# Patient Record
Sex: Female | Born: 1940 | Race: White | Hispanic: No | Marital: Single | Smoking: Former smoker
Health system: Southern US, Community
[De-identification: ages and names within clinical notes are randomized; demographics above are authoritative.]

## PROBLEM LIST (undated history)

## (undated) DIAGNOSIS — K219 Gastro-esophageal reflux disease without esophagitis: Secondary | ICD-10-CM

## (undated) DIAGNOSIS — F32A Depression, unspecified: Secondary | ICD-10-CM

## (undated) DIAGNOSIS — Z85118 Personal history of other malignant neoplasm of bronchus and lung: Secondary | ICD-10-CM

## (undated) DIAGNOSIS — F329 Major depressive disorder, single episode, unspecified: Secondary | ICD-10-CM

## (undated) DIAGNOSIS — J449 Chronic obstructive pulmonary disease, unspecified: Secondary | ICD-10-CM

## (undated) DIAGNOSIS — K449 Diaphragmatic hernia without obstruction or gangrene: Secondary | ICD-10-CM

## (undated) DIAGNOSIS — D509 Iron deficiency anemia, unspecified: Secondary | ICD-10-CM

## (undated) HISTORY — PX: ESOPHAGOGASTRODUODENOSCOPY: SHX1529

## (undated) HISTORY — PX: DIAPHRAGMATIC HERNIA REPAIR: SUR1167

## (undated) HISTORY — PX: GASTRIC FUNDOPLICATION: SHX226

## (undated) HISTORY — PX: COLONOSCOPY: SHX174

---

## 2011-07-15 DIAGNOSIS — C3491 Malignant neoplasm of unspecified part of right bronchus or lung: Secondary | ICD-10-CM | POA: Insufficient documentation

## 2012-11-06 DIAGNOSIS — Z9889 Other specified postprocedural states: Secondary | ICD-10-CM | POA: Insufficient documentation

## 2012-11-06 DIAGNOSIS — K219 Gastro-esophageal reflux disease without esophagitis: Secondary | ICD-10-CM | POA: Insufficient documentation

## 2016-04-12 DIAGNOSIS — J189 Pneumonia, unspecified organism: Secondary | ICD-10-CM | POA: Insufficient documentation

## 2016-04-12 DIAGNOSIS — J181 Lobar pneumonia, unspecified organism: Secondary | ICD-10-CM

## 2017-10-17 DIAGNOSIS — J9601 Acute respiratory failure with hypoxia: Secondary | ICD-10-CM | POA: Insufficient documentation

## 2018-01-04 ENCOUNTER — Inpatient Hospital Stay
Admission: RE | Admit: 2018-01-04 | Discharge: 2018-02-16 | Disposition: A | Discharge status: Home / Self Care | Payer: Medicare Other | Attending: Internal Medicine | Admitting: Internal Medicine

## 2018-01-04 DIAGNOSIS — K222 Esophageal obstruction: Secondary | ICD-10-CM

## 2018-01-04 DIAGNOSIS — K449 Diaphragmatic hernia without obstruction or gangrene: Secondary | ICD-10-CM

## 2018-01-04 DIAGNOSIS — Z9289 Personal history of other medical treatment: Secondary | ICD-10-CM

## 2018-01-04 DIAGNOSIS — Z789 Other specified health status: Secondary | ICD-10-CM

## 2018-01-04 DIAGNOSIS — J811 Chronic pulmonary edema: Secondary | ICD-10-CM

## 2018-01-04 DIAGNOSIS — Z934 Other artificial openings of gastrointestinal tract status: Secondary | ICD-10-CM

## 2018-01-04 DIAGNOSIS — J449 Chronic obstructive pulmonary disease, unspecified: Secondary | ICD-10-CM | POA: Diagnosis present

## 2018-01-04 DIAGNOSIS — T85598A Other mechanical complication of other gastrointestinal prosthetic devices, implants and grafts, initial encounter: Secondary | ICD-10-CM

## 2018-01-04 DIAGNOSIS — J9690 Respiratory failure, unspecified, unspecified whether with hypoxia or hypercapnia: Secondary | ICD-10-CM

## 2018-01-04 DIAGNOSIS — Z9889 Other specified postprocedural states: Secondary | ICD-10-CM

## 2018-01-04 DIAGNOSIS — Z85118 Personal history of other malignant neoplasm of bronchus and lung: Secondary | ICD-10-CM

## 2018-01-04 DIAGNOSIS — J9600 Acute respiratory failure, unspecified whether with hypoxia or hypercapnia: Secondary | ICD-10-CM

## 2018-01-04 DIAGNOSIS — Z931 Gastrostomy status: Secondary | ICD-10-CM

## 2018-01-04 DIAGNOSIS — K219 Gastro-esophageal reflux disease without esophagitis: Secondary | ICD-10-CM

## 2018-01-04 DIAGNOSIS — J90 Pleural effusion, not elsewhere classified: Secondary | ICD-10-CM

## 2018-01-04 HISTORY — DX: Personal history of other malignant neoplasm of bronchus and lung: Z85.118

## 2018-01-04 HISTORY — DX: Iron deficiency anemia, unspecified: D50.9

## 2018-01-04 HISTORY — DX: Chronic obstructive pulmonary disease, unspecified: J44.9

## 2018-01-04 HISTORY — DX: Major depressive disorder, single episode, unspecified: F32.9

## 2018-01-04 HISTORY — DX: Gastro-esophageal reflux disease without esophagitis: K21.9

## 2018-01-04 HISTORY — DX: Diaphragmatic hernia without obstruction or gangrene: K44.9

## 2018-01-04 HISTORY — DX: Depression, unspecified: F32.A

## 2018-01-05 ENCOUNTER — Encounter: Payer: Self-pay | Admitting: Internal Medicine

## 2018-01-05 ENCOUNTER — Other Ambulatory Visit (HOSPITAL_COMMUNITY): Payer: Self-pay

## 2018-01-05 DIAGNOSIS — K219 Gastro-esophageal reflux disease without esophagitis: Secondary | ICD-10-CM

## 2018-01-05 DIAGNOSIS — I482 Chronic atrial fibrillation: Secondary | ICD-10-CM | POA: Diagnosis not present

## 2018-01-05 DIAGNOSIS — J449 Chronic obstructive pulmonary disease, unspecified: Secondary | ICD-10-CM | POA: Diagnosis not present

## 2018-01-05 DIAGNOSIS — Z85118 Personal history of other malignant neoplasm of bronchus and lung: Secondary | ICD-10-CM | POA: Diagnosis not present

## 2018-01-05 DIAGNOSIS — J9621 Acute and chronic respiratory failure with hypoxia: Secondary | ICD-10-CM | POA: Diagnosis not present

## 2018-01-05 DIAGNOSIS — K449 Diaphragmatic hernia without obstruction or gangrene: Secondary | ICD-10-CM

## 2018-01-05 DIAGNOSIS — K222 Esophageal obstruction: Secondary | ICD-10-CM

## 2018-01-05 DIAGNOSIS — F419 Anxiety disorder, unspecified: Secondary | ICD-10-CM | POA: Insufficient documentation

## 2018-01-05 LAB — COMPREHENSIVE METABOLIC PANEL
ALBUMIN: 1.9 g/dL — AB (ref 3.5–5.0)
ALT: 19 U/L (ref 14–54)
AST: 19 U/L (ref 15–41)
Alkaline Phosphatase: 438 U/L — ABNORMAL HIGH (ref 38–126)
Anion gap: 9 (ref 5–15)
BILIRUBIN TOTAL: 0.4 mg/dL (ref 0.3–1.2)
BUN: 36 mg/dL — AB (ref 6–20)
CO2: 32 mmol/L (ref 22–32)
Calcium: 8.2 mg/dL — ABNORMAL LOW (ref 8.9–10.3)
Chloride: 104 mmol/L (ref 101–111)
Creatinine, Ser: 0.76 mg/dL (ref 0.44–1.00)
GFR calc Af Amer: >60 mL/min (ref >60)
GFR calc non Af Amer: >60 mL/min (ref >60)
Glucose, Bld: 102 mg/dL — ABNORMAL HIGH (ref 65–99)
POTASSIUM: 3.2 mmol/L — AB (ref 3.5–5.1)
Sodium: 145 mmol/L (ref 135–145)
TOTAL PROTEIN: 5.5 g/dL — AB (ref 6.5–8.1)

## 2018-01-05 LAB — CBC
HEMATOCRIT: 24.9 % — AB (ref 36.0–46.0)
HEMOGLOBIN: 7.3 g/dL — AB (ref 12.0–15.0)
MCH: 27.8 pg (ref 26.0–34.0)
MCHC: 29.3 g/dL — ABNORMAL LOW (ref 30.0–36.0)
MCV: 94.7 fL (ref 78.0–100.0)
Platelets: 183 10*3/uL (ref 150–400)
RBC: 2.63 MIL/uL — AB (ref 3.87–5.11)
RDW: 19.3 % — AB (ref 11.5–15.5)
WBC: 5.9 10*3/uL (ref 4.0–10.5)

## 2018-01-05 LAB — SEDIMENTATION RATE: Sed Rate: 70 mm/hr — ABNORMAL HIGH (ref 0–22)

## 2018-01-05 LAB — TSH: TSH: 0.969 u[IU]/mL (ref 0.350–4.500)

## 2018-01-05 MED ORDER — SUCRALFATE 1 GM/10ML PO SUSP
1.00 g | ORAL | Status: DC
Start: 2018-01-05 — End: 2018-01-05

## 2018-01-05 MED ORDER — ENOXAPARIN SODIUM 40 MG/0.4ML ~~LOC~~ SOLN
40.00 | SUBCUTANEOUS | Status: DC
Start: 2018-01-05 — End: 2018-01-05

## 2018-01-05 MED ORDER — HYDROCODONE-ACETAMINOPHEN 7.5-325 MG/15ML PO SOLN
5.00 | ORAL | Status: DC
Start: ? — End: 2018-01-05

## 2018-01-05 MED ORDER — BUDESONIDE 0.5 MG/2ML IN SUSP
0.50 | RESPIRATORY_TRACT | Status: DC
Start: 2018-01-05 — End: 2018-01-05

## 2018-01-05 MED ORDER — RISPERIDONE 1 MG/ML PO SOLN
3.00 | ORAL | Status: DC
Start: 2018-01-05 — End: 2018-01-05

## 2018-01-05 MED ORDER — MELATONIN 3 MG PO TABS
6.00 | ORAL_TABLET | ORAL | Status: DC
Start: 2018-01-05 — End: 2018-01-05

## 2018-01-05 MED ORDER — DEXTROSE 10 % IV SOLN
125.00 | INTRAVENOUS | Status: DC
Start: ? — End: 2018-01-05

## 2018-01-05 MED ORDER — GENERIC EXTERNAL MEDICATION
6.25 | Status: DC
Start: ? — End: 2018-01-05

## 2018-01-05 MED ORDER — GENERIC EXTERNAL MEDICATION
Status: DC
Start: 2018-01-05 — End: 2018-01-05

## 2018-01-05 MED ORDER — LIDOCAINE HCL 2 % EX GEL
CUTANEOUS | Status: DC
Start: ? — End: 2018-01-05

## 2018-01-05 MED ORDER — GENERIC EXTERNAL MEDICATION
40.00 | Status: DC
Start: 2018-01-05 — End: 2018-01-05

## 2018-01-05 MED ORDER — PROCHLORPERAZINE EDISYLATE 5 MG/ML IJ SOLN
10.00 | INTRAMUSCULAR | Status: DC
Start: ? — End: 2018-01-05

## 2018-01-05 MED ORDER — ONDANSETRON HCL 4 MG/2ML IJ SOLN
4.00 | INTRAMUSCULAR | Status: DC
Start: ? — End: 2018-01-05

## 2018-01-05 MED ORDER — CLONAZEPAM 0.5 MG PO TABS
0.50 | ORAL_TABLET | ORAL | Status: DC
Start: 2018-01-05 — End: 2018-01-05

## 2018-01-05 MED ORDER — INSULIN LISPRO 100 UNIT/ML ~~LOC~~ SOLN
2.00 | SUBCUTANEOUS | Status: DC
Start: 2018-01-05 — End: 2018-01-05

## 2018-01-05 MED ORDER — IOPAMIDOL (ISOVUE-300) INJECTION 61%
INTRAVENOUS | Status: AC
  Filled 2018-01-05: qty 50

## 2018-01-05 MED ORDER — GENERIC EXTERNAL MEDICATION
5.00 | Status: DC
Start: ? — End: 2018-01-05

## 2018-01-05 MED ORDER — IPRATROPIUM-ALBUTEROL 0.5-2.5 (3) MG/3ML IN SOLN
3.00 | RESPIRATORY_TRACT | Status: DC
Start: 2018-01-05 — End: 2018-01-05

## 2018-01-05 MED ORDER — ALBUTEROL SULFATE (2.5 MG/3ML) 0.083% IN NEBU
2.50 | INHALATION_SOLUTION | RESPIRATORY_TRACT | Status: DC
Start: ? — End: 2018-01-05

## 2018-01-05 MED ORDER — POLYETHYLENE GLYCOL 3350 17 G PO PACK
17.00 g | PACK | ORAL | Status: DC
Start: 2018-01-05 — End: 2018-01-05

## 2018-01-05 MED ORDER — FLUOXETINE HCL 20 MG/5ML PO SOLN
20.00 | ORAL | Status: DC
Start: 2018-01-05 — End: 2018-01-05

## 2018-01-05 MED ORDER — METOPROLOL TARTRATE 25 MG PO TABS
25.00 | ORAL_TABLET | ORAL | Status: DC
Start: 2018-01-05 — End: 2018-01-05

## 2018-01-05 MED ORDER — ACETAMINOPHEN 650 MG/20.3ML PO SOLN
325.00 | ORAL | Status: DC
Start: 2018-01-05 — End: 2018-01-05

## 2018-01-05 MED ORDER — GENERIC EXTERNAL MEDICATION
200.00 | Status: DC
Start: ? — End: 2018-01-05

## 2018-01-05 MED ORDER — ARFORMOTEROL TARTRATE 15 MCG/2ML IN NEBU
15.00 | INHALATION_SOLUTION | RESPIRATORY_TRACT | Status: DC
Start: 2018-01-05 — End: 2018-01-05

## 2018-01-05 MED ORDER — PHENOL 1.4 % MT LIQD
1.00 | OROMUCOSAL | Status: DC
Start: ? — End: 2018-01-05

## 2018-01-05 NOTE — Consult Note (Signed)
Pulmonary [place] PULMONARY SERVICE  Date of Service:  January 05, 2018  PULMONARY CONSULT   Sherry Bennett  JQB:[id number]  DOB: Apr 18, 1941   DOA: 01/04/2018  Referring Physician: Merton Border, MD  HPI: Sherry Bennett is a 77 y.o. female seen for follow up of Acute on Chronic Respiratory Failure.  Patient has multiple medical problems presented to the hospital for a scheduled endoscopy.  Patient apparently been having some melena.  Patient underwent a procedure for the endoscopy and her course was complicated by multiple problems.  She was apparently found to have a paraesophageal hernia and had this repaired along with Nissan fundoplication.  She had to go back to the operating room for an exploratory laparotomy in at that time she was found to have gastric perforations which were repaired.  Thoracotomy was done with a washout.  Came back to the ICU intubated on the ventilator.  She was subsequently not able to come off the ventilator because of ongoing issues with pleural effusions which were loculated felt to be empyema.  Patient had chest tubes placed for this purpose.  In addition she had low hemoglobin requiring transfusions.  Patient developed problems with hypotension felt to be septic because of infections and complications related to the surgery.  She was eventually requiring mechanical ventilation ongoing and underwent a tracheostomy and is transferred to our facility for further management and weaning  Review of Systems:  ROS performed and is unremarkable other than noted above.  Past Medical History:  Diagnosis Date  . COPD (chronic obstructive pulmonary disease) ([place])   . Depression   . Esophageal hernia   . GERD (gastroesophageal reflux disease)   . History of lung cancer   . Iron deficiency anemia     Past Surgical History:  Procedure Laterality Date  . CESAREAN SECTION    . COLONOSCOPY    . DIAPHRAGMATIC HERNIA REPAIR    .  ESOPHAGOGASTRODUODENOSCOPY    . GASTRIC FUNDOPLICATION      Social History:    reports that she has quit smoking. She has never used smokeless tobacco. She reports that she drank alcohol. She reports that she has current or past drug history.  Family History: Non-Contributory to the present illness  Allergies not on file  Medications: Reviewed on Rounds  Physical Exam:  Vitals:  Temperature 97.9 degrees pulse 69 respiratory 16 blood pressure 125/66 saturations 100%  Ventilator Settings  Mode of ventilation assist-control FiO2 35% tidal volume 475 peep 5  . General: Comfortable at this time . Eyes: Grossly normal lids, irises & conjunctiva . ENT: grossly tongue is normal . Neck: no obvious mass . Cardiovascular:  S1-S2 normal no gallop or rub . Respiratory:  Coarse breath sounds scattered distant rhonchi . Abdomen:  Soft distended . Skin: no rash seen on limited exam . Musculoskeletal: not rigid . Psychiatric:unable to assess . Neurologic: no seizure no involuntary movements         Labs on Admission:  Basic Metabolic Panel: Recent Labs  Lab 01/05/18 0704  NA 145  K 3.2*  CL 104  CO2 32  GLUCOSE 102*  BUN 36*  CREATININE 0.76  CALCIUM 8.2*    Liver Function Tests: Recent Labs  Lab 01/05/18 0704  AST 19  ALT 19  ALKPHOS 438*  BILITOT 0.4  PROT 5.5*  ALBUMIN 1.9*   No results for input(s): LIPASE, AMYLASE in the last 168 hours. No results for input(s): AMMONIA in the last 168  hours.  CBC: Recent Labs  Lab 01/05/18 0704  WBC 5.9  HGB 7.3*  HCT 24.9*  MCV 94.7  PLT 183    Cardiac Enzymes: No results for input(s): CKTOTAL, CKMB, CKMBINDEX, TROPONINI in the last 168 hours.  BNP (last 3 results) No results for input(s): BNP in the last 8760 hours.  ProBNP (last 3 results) No results for input(s): PROBNP in the last 8760 hours.   Radiological Exams on Admission: Dg Chest Port 1 View  Result Date: 01/05/2018 CLINICAL DATA:  Respiratory  failure EXAM: PORTABLE CHEST 1 VIEW COMPARISON:  None. FINDINGS: Tracheostomy in satisfactory position. Bibasilar airspace disease right greater than left. Small right effusion. Negative for pulmonary edema. IMPRESSION: Bibasilar airspace disease and small right effusion. No prior studies for comparison. Electronically Signed   By: Franchot Gallo M.D.   On: 01/05/2018 07:07   Dg Abd Portable 1v  Result Date: 01/05/2018 CLINICAL DATA:  Gastrojejunal tube adjusted/replaced several times over last two months, and has just transferred to [facility] here. EXAM: PORTABLE ABDOMEN - 1 VIEW COMPARISON:  None. FINDINGS: Gastrojejunostomy with the distal tip projecting over the proximal jejunum. There is no bowel dilatation to suggest obstruction. There is no evidence of pneumoperitoneum, portal venous gas or pneumatosis. There are no pathologic calcifications along the expected course of the ureters. The osseous structures are unremarkable. IMPRESSION: Gastrojejunostomy with the distal tip projecting over the proximal jejunum. Electronically Signed   By: Kathreen Devoid   On: 01/05/2018 11:03    Assessment/Plan Active Problems:   Paraesophageal hernia   Esophageal obstruction   GERD (gastroesophageal reflux disease)   COPD (chronic obstructive pulmonary disease) (HCC)   History of lung cancer   1.  acute on chronic Respiratory failure with hypoxia she remains vent dependent right now is on full support.  She was attempted on a spontaneous breathing trial we will continue to assess and place on pressure support as tolerated. 2. COPD we will continue with present management right now appears to be adequately controlled 3. Esophageal perforation status post surgical intervention with multiple complications including development of empyema need to continue to monitor this very closely. 4. History of adenocarcinoma of the lung which can be followed up after she is discharged  5. GERD primary presenting  problem  I have personally seen and evaluated the patient, evaluated laboratory and imaging results, formulated the assessment and plan and placed orders. The Patient requires high complexity decision making for assessment and support.  Case was discussed on Rounds with the Respiratory Therapy Staff Time Spent 43minutes  Allyne Gee, MD [facility] Pulmonary Critical Care Medicine Sleep Medicine

## 2018-01-06 DIAGNOSIS — J9621 Acute and chronic respiratory failure with hypoxia: Secondary | ICD-10-CM | POA: Diagnosis not present

## 2018-01-06 DIAGNOSIS — K219 Gastro-esophageal reflux disease without esophagitis: Secondary | ICD-10-CM | POA: Diagnosis not present

## 2018-01-06 DIAGNOSIS — K222 Esophageal obstruction: Secondary | ICD-10-CM | POA: Diagnosis not present

## 2018-01-06 DIAGNOSIS — K449 Diaphragmatic hernia without obstruction or gangrene: Secondary | ICD-10-CM

## 2018-01-06 DIAGNOSIS — J449 Chronic obstructive pulmonary disease, unspecified: Secondary | ICD-10-CM | POA: Diagnosis not present

## 2018-01-06 NOTE — Progress Notes (Signed)
Pulmonary [place]   PULMONARY SERVICE  PROGRESS NOTE  Date of Service: January 06, 2018  Sherry Bennett  NID:[id number]  DOB: 1941-01-25   DOA: 01/04/2018  Referring Physician: Merton Border, MD  HPI: Sherry Bennett is a 77 y.o. female seen for follow up of Acute on Chronic Respiratory Failure.  Patient's weaning on pressure support so far looks good has been tolerating it without distress.  Medications: Reviewed on Rounds  Physical Exam:  Vitals: Temperature 98.4 pulse 72 respiratory 19 blood pressure 127/74 saturations 100%  Ventilator Settings mode of ventilation pressure support FiO2 28% tidal volume 327 pressure support 12 PEEP 5  . General: Comfortable at this time . Eyes: Grossly normal lids, irises & conjunctiva . ENT: grossly tongue is normal . Neck: no obvious mass . Cardiovascular: S1-S2 normal no gallop . Respiratory: Good air entry no rhonchi . Abdomen: Soft nontender . Skin: no rash seen on limited exam . Musculoskeletal: not rigid . Psychiatric:unable to assess . Neurologic: no seizure no involuntary movements         Labs on Admission:  Basic Metabolic Panel: Recent Labs  Lab 01/05/18 0704  NA 145  K 3.2*  CL 104  CO2 32  GLUCOSE 102*  BUN 36*  CREATININE 0.76  CALCIUM 8.2*    Liver Function Tests: Recent Labs  Lab 01/05/18 0704  AST 19  ALT 19  ALKPHOS 438*  BILITOT 0.4  PROT 5.5*  ALBUMIN 1.9*   No results for input(s): LIPASE, AMYLASE in the last 168 hours. No results for input(s): AMMONIA in the last 168 hours.  CBC: Recent Labs  Lab 01/05/18 0704  WBC 5.9  HGB 7.3*  HCT 24.9*  MCV 94.7  PLT 183    Cardiac Enzymes: No results for input(s): CKTOTAL, CKMB, CKMBINDEX, TROPONINI in the last 168 hours.  BNP (last 3 results) No results for input(s): BNP in the last 8760 hours.  ProBNP (last 3 results) No results for input(s): PROBNP in the last 8760 hours.  Radiological  Exams on Admission: Dg Chest Port 1 View  Result Date: 01/05/2018 CLINICAL DATA:  Respiratory failure EXAM: PORTABLE CHEST 1 VIEW COMPARISON:  None. FINDINGS: Tracheostomy in satisfactory position. Bibasilar airspace disease right greater than left. Small right effusion. Negative for pulmonary edema. IMPRESSION: Bibasilar airspace disease and small right effusion. No prior studies for comparison. Electronically Signed   By: Franchot Gallo M.D.   On: 01/05/2018 07:07   Dg Abd Portable 1v  Result Date: 01/05/2018 CLINICAL DATA:  Gastrojejunal tube adjusted/replaced several times over last two months, and has just transferred to [facility] here. EXAM: PORTABLE ABDOMEN - 1 VIEW COMPARISON:  None. FINDINGS: Gastrojejunostomy with the distal tip projecting over the proximal jejunum. There is no bowel dilatation to suggest obstruction. There is no evidence of pneumoperitoneum, portal venous gas or pneumatosis. There are no pathologic calcifications along the expected course of the ureters. The osseous structures are unremarkable. IMPRESSION: Gastrojejunostomy with the distal tip projecting over the proximal jejunum. Electronically Signed   By: Kathreen Devoid   On: 01/05/2018 11:03    Assessment/Plan Active Problems:   Paraesophageal hernia   Esophageal obstruction   GERD (gastroesophageal reflux disease)   COPD (chronic obstructive pulmonary disease) (HCC)   History of lung cancer   1. Acute on chronic respiratory failure with hypoxia has been resumed on the weaning patient's doing well so far we will advance pressure support weans as tolerated we will  continue with pulmonary toilet supportive care 2. COPD stable at this time continue with present management 3. GERD stable we will continue present management 4. Esophageal obstruction with multiple complications we will continue to monitor at this stage 5. Paraesophageal hernia repaired   I have personally seen and evaluated the patient,  evaluated laboratory and imaging results, formulated the assessment and plan and placed orders. The Patient requires high complexity decision making for assessment and support.  Case was discussed on Rounds with the Respiratory Therapy Staff  Allyne Gee, MD [facility] Pulmonary Critical Care Medicine Sleep Medicine

## 2018-01-08 DIAGNOSIS — J9621 Acute and chronic respiratory failure with hypoxia: Secondary | ICD-10-CM | POA: Diagnosis not present

## 2018-01-08 DIAGNOSIS — J449 Chronic obstructive pulmonary disease, unspecified: Secondary | ICD-10-CM | POA: Diagnosis not present

## 2018-01-08 DIAGNOSIS — K222 Esophageal obstruction: Secondary | ICD-10-CM | POA: Diagnosis not present

## 2018-01-08 DIAGNOSIS — K219 Gastro-esophageal reflux disease without esophagitis: Secondary | ICD-10-CM | POA: Diagnosis not present

## 2018-01-08 LAB — BASIC METABOLIC PANEL
Anion gap: 9 (ref 5–15)
BUN: 30 mg/dL — AB (ref 6–20)
CALCIUM: 7.9 mg/dL — AB (ref 8.9–10.3)
CO2: 30 mmol/L (ref 22–32)
CREATININE: 0.8 mg/dL (ref 0.44–1.00)
Chloride: 104 mmol/L (ref 101–111)
GFR calc Af Amer: >60 mL/min (ref >60)
Glucose, Bld: 105 mg/dL — ABNORMAL HIGH (ref 65–99)
Potassium: 3.4 mmol/L — ABNORMAL LOW (ref 3.5–5.1)
Sodium: 143 mmol/L (ref 135–145)

## 2018-01-08 LAB — MAGNESIUM: MAGNESIUM: 2 mg/dL (ref 1.7–2.4)

## 2018-01-08 NOTE — Progress Notes (Signed)
Pulmonary [place]   PULMONARY SERVICE  PROGRESS NOTE  Date of Service: January 08, 2018  Sherry Bennett  [id number]:[id number]  DOB: [birth year]   DOA: 01/04/2018  Referring Physician: Merton Border, MD  HPI: Sherry Bennett is a 77 y.o. female seen for follow up of Acute on Chronic Respiratory Failure.  Weaning on pressure support mode currently is on 20% oxygen doing fairly well  Medications: Reviewed on Rounds  Physical Exam:  Vitals: Temperature 98 pulse 77 respiratory rate 30 blood pressure 147/74 saturation 90%  Ventilator Settings mode of ventilation pressure support FiO2 20% tidal volume 340 pressure support 12 PEEP 5  . General: Comfortable at this time . Eyes: Grossly normal lids, irises & conjunctiva . ENT: grossly tongue is normal . Neck: no obvious mass . Cardiovascular: S1-S2 normal no gallop . Respiratory: Clear . Abdomen: Soft nontender . Skin: no rash seen on limited exam . Musculoskeletal: not rigid . Psychiatric:unable to assess . Neurologic: no seizure no involuntary movements         Labs on Admission:  Basic Metabolic Panel: Recent Labs  Lab 01/05/18 0704 01/08/18 0515  NA 145 143  K 3.2* 3.4*  CL 104 104  CO2 32 30  GLUCOSE 102* 105*  BUN 36* 30*  CREATININE 0.76 0.80  CALCIUM 8.2* 7.9*  MG  --  2.0    Liver Function Tests: Recent Labs  Lab 01/05/18 0704  AST 19  ALT 19  ALKPHOS 438*  BILITOT 0.4  PROT 5.5*  ALBUMIN 1.9*   No results for input(s): LIPASE, AMYLASE in the last 168 hours. No results for input(s): AMMONIA in the last 168 hours.  CBC: Recent Labs  Lab 01/05/18 0704  WBC 5.9  HGB 7.3*  HCT 24.9*  MCV 94.7  PLT 183    Cardiac Enzymes: No results for input(s): CKTOTAL, CKMB, CKMBINDEX, TROPONINI in the last 168 hours.  BNP (last 3 results) No results for input(s): BNP in the last 8760 hours.  ProBNP (last 3 results) No results for input(s): PROBNP in the last  8760 hours.  Radiological Exams on Admission: Dg Chest Port 1 View  Result Date: 01/05/2018 CLINICAL DATA:  Respiratory failure EXAM: PORTABLE CHEST 1 VIEW COMPARISON:  None. FINDINGS: Tracheostomy in satisfactory position. Bibasilar airspace disease right greater than left. Small right effusion. Negative for pulmonary edema. IMPRESSION: Bibasilar airspace disease and small right effusion. No prior studies for comparison. Electronically Signed   By: Franchot Gallo M.D.   On: 01/05/2018 07:07   Dg Abd Portable 1v  Result Date: 01/05/2018 CLINICAL DATA:  Gastrojejunal tube adjusted/replaced several times over last two months, and has just transferred to [facility] here. EXAM: PORTABLE ABDOMEN - 1 VIEW COMPARISON:  None. FINDINGS: Gastrojejunostomy with the distal tip projecting over the proximal jejunum. There is no bowel dilatation to suggest obstruction. There is no evidence of pneumoperitoneum, portal venous gas or pneumatosis. There are no pathologic calcifications along the expected course of the ureters. The osseous structures are unremarkable. IMPRESSION: Gastrojejunostomy with the distal tip projecting over the proximal jejunum. Electronically Signed   By: Kathreen Devoid   On: 01/05/2018 11:03    Assessment/Plan Active Problems:   Paraesophageal hernia   Esophageal obstruction   GERD (gastroesophageal reflux disease)   COPD (chronic obstructive pulmonary disease) (HCC)   History of lung cancer   1. Acute on chronic respiratory failure with hypoxia patient will continue to wean on pressure support was able  to complete 16 hours yesterday we will continue to advance her weaning as per the protocol 2. Esophageal obstruction resolved we will continue to monitor 3. Paraesophageal hernia treated 4. COPD we will continue to monitor continue with present management   I have personally seen and evaluated the patient, evaluated laboratory and imaging results, formulated the assessment and  plan and placed orders. The Patient requires high complexity decision making for assessment and support.  Case was discussed on Rounds with the Respiratory Therapy Staff  Allyne Gee, MD [facility] Pulmonary Critical Care Medicine Sleep Medicine

## 2018-01-09 DIAGNOSIS — K219 Gastro-esophageal reflux disease without esophagitis: Secondary | ICD-10-CM | POA: Diagnosis not present

## 2018-01-09 DIAGNOSIS — J449 Chronic obstructive pulmonary disease, unspecified: Secondary | ICD-10-CM | POA: Diagnosis not present

## 2018-01-09 DIAGNOSIS — J9621 Acute and chronic respiratory failure with hypoxia: Secondary | ICD-10-CM | POA: Diagnosis not present

## 2018-01-09 DIAGNOSIS — K222 Esophageal obstruction: Secondary | ICD-10-CM | POA: Diagnosis not present

## 2018-01-09 NOTE — Progress Notes (Signed)
Pulmonary [place]   PULMONARY SERVICE  PROGRESS NOTE  Date of Service:  January 09, 2018  Silver Achey Langwell  ATF:[id number]  DOB: [birth year]   DOA: 01/04/2018  Referring Physician: Merton Border, MD  HPI: Sherry Bennett is a 77 y.o. female seen for follow up of Acute on Chronic Respiratory Failure.  Patient is doing fairly well on pressure support mode has been doing fine for about 16 hr.  Currently is on assist-control and will be resumed on a full wean.  According to respiratory therapy should be able to tolerate aerosolized T collar so we will make a run for that  Medications: Reviewed on Rounds  Physical Exam:  Vitals:  Temperature 97.9 degrees pulse 66 respiratory 19 blood pressure 102/53 saturations 100%  Ventilator Settings  Mode of ventilation assist-control FiO2 28% tidal volume 451 peep 5  . General: Comfortable at this time . Eyes: Grossly normal lids, irises & conjunctiva . ENT: grossly tongue is normal . Neck: no obvious mass . Cardiovascular:  S1-S2 normal no gallop or rub . Respiratory:  No rhonchi coarse breath sounds . Abdomen:  Soft nontender . Skin: no rash seen on limited exam . Musculoskeletal: not rigid . Psychiatric:unable to assess . Neurologic: no seizure no involuntary movements         Labs on Admission:  Basic Metabolic Panel: Recent Labs  Lab 01/05/18 0704 01/08/18 0515  NA 145 143  K 3.2* 3.4*  CL 104 104  CO2 32 30  GLUCOSE 102* 105*  BUN 36* 30*  CREATININE 0.76 0.80  CALCIUM 8.2* 7.9*  MG  --  2.0    Liver Function Tests: Recent Labs  Lab 01/05/18 0704  AST 19  ALT 19  ALKPHOS 438*  BILITOT 0.4  PROT 5.5*  ALBUMIN 1.9*   No results for input(s): LIPASE, AMYLASE in the last 168 hours. No results for input(s): AMMONIA in the last 168 hours.  CBC: Recent Labs  Lab 01/05/18 0704  WBC 5.9  HGB 7.3*  HCT 24.9*  MCV 94.7  PLT 183    Cardiac Enzymes: No results for  input(s): CKTOTAL, CKMB, CKMBINDEX, TROPONINI in the last 168 hours.  BNP (last 3 results) No results for input(s): BNP in the last 8760 hours.  ProBNP (last 3 results) No results for input(s): PROBNP in the last 8760 hours.  Radiological Exams on Admission: No results found.  Assessment/Plan Active Problems:   Paraesophageal hernia   Esophageal obstruction   GERD (gastroesophageal reflux disease)   COPD (chronic obstructive pulmonary disease) (HCC)   History of lung cancer   1.  acute on chronic Respiratory failure with hypoxia doing fine we will continue with weaning advanced aerosolized T collar today if patient is able to tolerate will continue with pulmonary toilet secretion management supportive care 2. Esophageal is a structured status post procedure to disimpact will continue to monitor 3. GERD stable at this time  4. COPD advanced disease we will continue with present management 5. History of lung cancer will monitor once upon discharge   I have personally seen and evaluated the patient, evaluated laboratory and imaging results, formulated the assessment and plan and placed orders. The Patient requires high complexity decision making for assessment and support.  Case was discussed on Rounds with the Respiratory Therapy Staff  Allyne Gee, MD [facility] Pulmonary Critical Care Medicine Sleep Medicine

## 2018-01-10 ENCOUNTER — Other Ambulatory Visit (HOSPITAL_COMMUNITY): Payer: Self-pay

## 2018-01-10 DIAGNOSIS — K219 Gastro-esophageal reflux disease without esophagitis: Secondary | ICD-10-CM | POA: Diagnosis not present

## 2018-01-10 DIAGNOSIS — K222 Esophageal obstruction: Secondary | ICD-10-CM | POA: Diagnosis not present

## 2018-01-10 DIAGNOSIS — J9621 Acute and chronic respiratory failure with hypoxia: Secondary | ICD-10-CM | POA: Diagnosis not present

## 2018-01-10 DIAGNOSIS — J449 Chronic obstructive pulmonary disease, unspecified: Secondary | ICD-10-CM | POA: Diagnosis not present

## 2018-01-10 HISTORY — PX: IR GJ TUBE CHANGE: IMG1440

## 2018-01-10 MED ORDER — LIDOCAINE HCL 2 % EX GEL: CUTANEOUS | Status: DC | PRN

## 2018-01-10 MED ORDER — LIDOCAINE VISCOUS 2 % MT SOLN
OROMUCOSAL | Status: AC
  Filled 2018-01-10: qty 15

## 2018-01-10 MED ORDER — IOPAMIDOL (ISOVUE-300) INJECTION 61%
INTRAVENOUS | Status: AC
  Filled 2018-01-10: qty 50

## 2018-01-10 MED ORDER — IOPAMIDOL (ISOVUE-300) INJECTION 61%
INTRAVENOUS | Status: AC
  Administered 2018-01-10: 10 mL
  Filled 2018-01-10: qty 50

## 2018-01-10 NOTE — Procedures (Signed)
18 Fr GJ tube exchange Tip is in Jejunum EBL 0 Comp 0

## 2018-01-10 NOTE — Progress Notes (Signed)
Pulmonary [place]   PULMONARY SERVICE  PROGRESS NOTE  Date of Service: January 10, 2018  Sherry Bennett  [id number]:[id number]  DOB: 08-28-1941   DOA: 01/04/2018  Referring Physician: Merton Border, MD  HPI: Sherry Bennett is a 76 y.o. female seen for follow up of Acute on Chronic Respiratory Failure.  Patient is off the ventilator has been on T collar for more than 24 hours tolerating well.  Saturations are excellent currently is requiring 30% FiO2  Medications: Reviewed on Rounds  Physical Exam:  Vitals: Temperature 97.0 pulse 76 respiratory rate 31 blood pressure 133/63 saturation 93%  Ventilator Settings off the ventilator on T collar FiO2 30%  . General: Comfortable at this time . Eyes: Grossly normal lids, irises & conjunctiva . ENT: grossly tongue is normal . Neck: no obvious mass . Cardiovascular: S1-S2 normal no gallop . Respiratory: No rhonchi expansion equal . Abdomen: Nondistended . Skin: no rash seen on limited exam . Musculoskeletal: not rigid . Psychiatric:unable to assess . Neurologic: no seizure no involuntary movements         Labs on Admission:  Basic Metabolic Panel: Recent Labs  Lab 01/05/18 0704 01/08/18 0515  NA 145 143  K 3.2* 3.4*  CL 104 104  CO2 32 30  GLUCOSE 102* 105*  BUN 36* 30*  CREATININE 0.76 0.80  CALCIUM 8.2* 7.9*  MG  --  2.0    Liver Function Tests: Recent Labs  Lab 01/05/18 0704  AST 19  ALT 19  ALKPHOS 438*  BILITOT 0.4  PROT 5.5*  ALBUMIN 1.9*   No results for input(s): LIPASE, AMYLASE in the last 168 hours. No results for input(s): AMMONIA in the last 168 hours.  CBC: Recent Labs  Lab 01/05/18 0704  WBC 5.9  HGB 7.3*  HCT 24.9*  MCV 94.7  PLT 183    Cardiac Enzymes: No results for input(s): CKTOTAL, CKMB, CKMBINDEX, TROPONINI in the last 168 hours.  BNP (last 3 results) No results for input(s): BNP in the last 8760 hours.  ProBNP (last 3 results) No  results for input(s): PROBNP in the last 8760 hours.  Radiological Exams on Admission: Ct Abdomen Wo Contrast  Result Date: 01/10/2018 CLINICAL DATA:  Gastrostomy tube EXAM: CT ABDOMEN AND PELVIS WITHOUT CONTRAST TECHNIQUE: Multidetector CT imaging of the abdomen and pelvis was performed following the standard protocol without IV contrast. COMPARISON:  None. FINDINGS: Lower chest: There is extensive consolidation towards both lung bases. Large hiatal hernia is present. Hepatobiliary: High-density material is layering in the gallbladder. Liver is unremarkable. Pancreas: Unremarkable Spleen: Unremarkable Adrenals/Urinary Tract: No hydronephrosis. Hyperdense space-occupying lesion in the anterior right kidney measures 5 mm in size. Adrenal glands are within normal limits. Left kidney is unremarkable. Stomach/Bowel: Gastrojejunostomy tube is in place. Appendix is unremarkable and partially imaged. No obvious mass in the colon. Diverticulosis of the descending and sigmoid colon. No evidence of small-bowel obstruction. Vascular/Lymphatic: No abnormal retroperitoneal adenopathy. No evidence of aortic aneurysm. Other: No free fluid. Musculoskeletal: No vertebral compression deformity. IMPRESSION: Gastrojejunostomy tube is in place. Bibasilar pulmonary consolidation Possible gallstones.  Ultrasound is recommended. 5 mm hyperdense lesion in the right kidney. Consider MRI with and without contrast when feasible. Electronically Signed   By: Marybelle Killings M.D.   On: 01/10/2018 09:07    Assessment/Plan Active Problems:   Paraesophageal hernia   Esophageal obstruction   GERD (gastroesophageal reflux disease)   COPD (chronic obstructive pulmonary disease) (HCC)   History  of lung cancer   1. Acute on chronic respiratory failure with hypoxia continues to do fine on weaning currently is on T collar wean has been on 30% try to wean down as tolerated.  Hopefully will be able to switch over to a cuff less trach and then  begin capping trials 2. COPD stable at this time continue with present management 3. Esophageal obstruction resolved 4. History of lung cancer stable 5. GERD stable   I have personally seen and evaluated the patient, evaluated laboratory and imaging results, formulated the assessment and plan and placed orders. The Patient requires high complexity decision making for assessment and support.  Case was discussed on Rounds with the Respiratory Therapy Staff  Allyne Gee, MD [facility] Pulmonary Critical Care Medicine Sleep Medicine

## 2018-01-10 NOTE — Progress Notes (Signed)
Pt remains stable.  Easy unlabored breathing noted.  Pt denies any discomfort. Waiting for IR MD to arrive to do G/J Tube exchange.

## 2018-01-11 ENCOUNTER — Other Ambulatory Visit (HOSPITAL_COMMUNITY): Payer: Self-pay

## 2018-01-11 DIAGNOSIS — J449 Chronic obstructive pulmonary disease, unspecified: Secondary | ICD-10-CM | POA: Diagnosis not present

## 2018-01-11 DIAGNOSIS — J9621 Acute and chronic respiratory failure with hypoxia: Secondary | ICD-10-CM | POA: Diagnosis not present

## 2018-01-11 DIAGNOSIS — K219 Gastro-esophageal reflux disease without esophagitis: Secondary | ICD-10-CM | POA: Diagnosis not present

## 2018-01-11 DIAGNOSIS — K222 Esophageal obstruction: Secondary | ICD-10-CM | POA: Diagnosis not present

## 2018-01-11 LAB — BASIC METABOLIC PANEL
ANION GAP: 10 (ref 5–15)
BUN: 18 mg/dL (ref 6–20)
CHLORIDE: 100 mmol/L — AB (ref 101–111)
CO2: 32 mmol/L (ref 22–32)
Calcium: 8.4 mg/dL — ABNORMAL LOW (ref 8.9–10.3)
Creatinine, Ser: 0.7 mg/dL (ref 0.44–1.00)
GFR calc Af Amer: >60 mL/min (ref >60)
GFR calc non Af Amer: >60 mL/min (ref >60)
GLUCOSE: 127 mg/dL — AB (ref 65–99)
POTASSIUM: 2.9 mmol/L — AB (ref 3.5–5.1)
Sodium: 142 mmol/L (ref 135–145)

## 2018-01-11 NOTE — Progress Notes (Signed)
Pulmonary [place]   PULMONARY SERVICE  PROGRESS NOTE  Date of Service:  January 11, 2018  Sherry Bennett  [id number]:[id number]  DOB: 28-Mar-1941   DOA: 01/04/2018  Referring Physician: Merton Border, MD  HPI: Sherry Bennett is a 77 y.o. female seen for follow up of Acute on Chronic Respiratory Failure.  Patient is on aerosolized T collar has been off the ventilator for more than 48 hr looks good no desaturations are concerned at this time  Medications: Reviewed on Rounds  Physical Exam:  Vitals:  Temperature 98.6 degrees pulse 77 respiratory 18 blood pressure 104/60 saturations 96%  Ventilator Settings  Mode aerosolized T collar FiO2 35%  . General: Comfortable at this time . Eyes: Grossly normal lids, irises & conjunctiva . ENT: grossly tongue is normal . Neck: no obvious mass . Cardiovascular:  S1-S2 normal no gallop . Respiratory:  No rhonchi expansion is equal . Abdomen:  Soft nontender . Skin: no rash seen on limited exam . Musculoskeletal: not rigid . Psychiatric:unable to assess . Neurologic: no seizure no involuntary movements         Labs on Admission:  Basic Metabolic Panel: Recent Labs  Lab 01/05/18 0704 01/08/18 0515 01/11/18 0535  NA 145 143 142  K 3.2* 3.4* 2.9*  CL 104 104 100*  CO2 32 30 32  GLUCOSE 102* 105* 127*  BUN 36* 30* 18  CREATININE 0.76 0.80 0.70  CALCIUM 8.2* 7.9* 8.4*  MG  --  2.0  --     Liver Function Tests: Recent Labs  Lab 01/05/18 0704  AST 19  ALT 19  ALKPHOS 438*  BILITOT 0.4  PROT 5.5*  ALBUMIN 1.9*   No results for input(s): LIPASE, AMYLASE in the last 168 hours. No results for input(s): AMMONIA in the last 168 hours.  CBC: Recent Labs  Lab 01/05/18 0704  WBC 5.9  HGB 7.3*  HCT 24.9*  MCV 94.7  PLT 183    Cardiac Enzymes: No results for input(s): CKTOTAL, CKMB, CKMBINDEX, TROPONINI in the last 168 hours.  BNP (last 3 results) No results for input(s): BNP in  the last 8760 hours.  ProBNP (last 3 results) No results for input(s): PROBNP in the last 8760 hours.  Radiological Exams on Admission: Ct Abdomen Wo Contrast  Result Date: 01/10/2018 CLINICAL DATA:  Gastrostomy tube EXAM: CT ABDOMEN AND PELVIS WITHOUT CONTRAST TECHNIQUE: Multidetector CT imaging of the abdomen and pelvis was performed following the standard protocol without IV contrast. COMPARISON:  None. FINDINGS: Lower chest: There is extensive consolidation towards both lung bases. Large hiatal hernia is present. Hepatobiliary: High-density material is layering in the gallbladder. Liver is unremarkable. Pancreas: Unremarkable Spleen: Unremarkable Adrenals/Urinary Tract: No hydronephrosis. Hyperdense space-occupying lesion in the anterior right kidney measures 5 mm in size. Adrenal glands are within normal limits. Left kidney is unremarkable. Stomach/Bowel: Gastrojejunostomy tube is in place. Appendix is unremarkable and partially imaged. No obvious mass in the colon. Diverticulosis of the descending and sigmoid colon. No evidence of small-bowel obstruction. Vascular/Lymphatic: No abnormal retroperitoneal adenopathy. No evidence of aortic aneurysm. Other: No free fluid. Musculoskeletal: No vertebral compression deformity. IMPRESSION: Gastrojejunostomy tube is in place. Bibasilar pulmonary consolidation Possible gallstones.  Ultrasound is recommended. 5 mm hyperdense lesion in the right kidney. Consider MRI with and without contrast when feasible. Electronically Signed   By: Marybelle Killings M.D.   On: 01/10/2018 09:07   Dg Chest Port 1 View  Result Date: 01/11/2018 CLINICAL DATA:  Acute respiratory failure EXAM: PORTABLE CHEST 1 VIEW COMPARISON:  12/28/2017 FINDINGS: Stable multifocal patchy opacities in the left upper lobe and bilateral lower lobes, suggesting multifocal infection. Stable increased perihilar markings, suggesting mild interstitial edema. Associated small bilateral pleural effusions,  unchanged. Cardiomegaly. Tracheostomy in satisfactory position. IMPRESSION: Multifocal patchy opacities in the left upper lobe and bilateral lower lobes, suggesting multifocal pneumonia, grossly unchanged. Cardiomegaly with possible mild interstitial edema and small bilateral pleural effusions, unchanged. Electronically Signed   By: Julian Hy M.D.   On: 01/11/2018 16:24    Assessment/Plan Active Problems:   Paraesophageal hernia   Esophageal obstruction   GERD (gastroesophageal reflux disease)   COPD (chronic obstructive pulmonary disease) (HCC)   History of lung cancer   1.  acute on chronic Respiratory failure with hypoxia patient at this time 78 weaning doing fairly well were going to continue to advance the wean as tolerated so far doing fine on aerosolized T collar we will downsize the trach to a size 4 cuffless 2.  GERD stable at this time we will continue to monitor 3. COPD stable monitor management at this time we will continue to follow 4. Esophageal obstruction will need follow-up after she is hopefully decannulated  I have personally seen and evaluated the patient, evaluated laboratory and imaging results, formulated the assessment and plan and placed orders. The Patient requires high complexity decision making for assessment and support.  Case was discussed on Rounds with the Respiratory Therapy Staff  Allyne Gee, MD [facility] Pulmonary Critical Care Medicine Sleep Medicine

## 2018-01-11 NOTE — Progress Notes (Signed)
Pt arrived to IR for a GJ tube exchange.  No allergies were listed in the Epic chart.  Skin was prepped with betadine.  GJ tube was exchanged.  After procedure,  we were informed that the patient had an iodine allergy.  The skin was unremarkable and intact.  To ensure that all betadine was removed from skin, it was cleansed with soap and water and re-dressed.  Skin remained unremarkable and intact.  Hoss MD was informed.  No action was needed.

## 2018-01-12 DIAGNOSIS — J449 Chronic obstructive pulmonary disease, unspecified: Secondary | ICD-10-CM | POA: Diagnosis not present

## 2018-01-12 DIAGNOSIS — K219 Gastro-esophageal reflux disease without esophagitis: Secondary | ICD-10-CM | POA: Diagnosis not present

## 2018-01-12 DIAGNOSIS — J9621 Acute and chronic respiratory failure with hypoxia: Secondary | ICD-10-CM | POA: Diagnosis not present

## 2018-01-12 DIAGNOSIS — K222 Esophageal obstruction: Secondary | ICD-10-CM | POA: Diagnosis not present

## 2018-01-12 LAB — POTASSIUM: POTASSIUM: 3.3 mmol/L — AB (ref 3.5–5.1)

## 2018-01-12 NOTE — Progress Notes (Signed)
Pulmonary [place]   PULMONARY SERVICE  PROGRESS NOTE  Date of Service: January 12, 2018  Sherry Bennett  QMV:[id number]  DOB: [birth year]   DOA: 01/04/2018  Referring Physician: Merton Border, MD  HPI: Sherry Bennett is a 77 y.o. female seen for follow up of Acute on Chronic Respiratory Failure.  Patient remains on T collar at this time patient is on 20% oxygen has been tolerating it well.  Sitting in the chair out of the bed.  Has a size 4 trach in place.  We should be able to advance to PMV and then hopefully start capping soon  Medications: Reviewed on Rounds  Physical Exam:  Vitals: Temperature 97.4 pulse 78 respiratory 27 blood pressure 110/62 saturations 90%  Ventilator Settings T collar 28% FiO2  . General: Comfortable at this time . Eyes: Grossly normal lids, irises & conjunctiva . ENT: grossly tongue is normal . Neck: no obvious mass . Cardiovascular: S1-S2 normal no gallop or rub . Respiratory: Clear . Abdomen: Soft . Skin: no rash seen on limited exam . Musculoskeletal: not rigid . Psychiatric:unable to assess . Neurologic: no seizure no involuntary movements         Labs on Admission:  Basic Metabolic Panel: Recent Labs  Lab 01/08/18 0515 01/11/18 0535 01/12/18 0623  NA 143 142  --   K 3.4* 2.9* 3.3*  CL 104 100*  --   CO2 30 32  --   GLUCOSE 105* 127*  --   BUN 30* 18  --   CREATININE 0.80 0.70  --   CALCIUM 7.9* 8.4*  --   MG 2.0  --   --     Liver Function Tests: No results for input(s): AST, ALT, ALKPHOS, BILITOT, PROT, ALBUMIN in the last 168 hours. No results for input(s): LIPASE, AMYLASE in the last 168 hours. No results for input(s): AMMONIA in the last 168 hours.  CBC: No results for input(s): WBC, NEUTROABS, HGB, HCT, MCV, PLT in the last 168 hours.  Cardiac Enzymes: No results for input(s): CKTOTAL, CKMB, CKMBINDEX, TROPONINI in the last 168 hours.  BNP (last 3 results) No results  for input(s): BNP in the last 8760 hours.  ProBNP (last 3 results) No results for input(s): PROBNP in the last 8760 hours.  Radiological Exams on Admission: Ct Abdomen Wo Contrast  Result Date: 01/10/2018 CLINICAL DATA:  Gastrostomy tube EXAM: CT ABDOMEN AND PELVIS WITHOUT CONTRAST TECHNIQUE: Multidetector CT imaging of the abdomen and pelvis was performed following the standard protocol without IV contrast. COMPARISON:  None. FINDINGS: Lower chest: There is extensive consolidation towards both lung bases. Large hiatal hernia is present. Hepatobiliary: High-density material is layering in the gallbladder. Liver is unremarkable. Pancreas: Unremarkable Spleen: Unremarkable Adrenals/Urinary Tract: No hydronephrosis. Hyperdense space-occupying lesion in the anterior right kidney measures 5 mm in size. Adrenal glands are within normal limits. Left kidney is unremarkable. Stomach/Bowel: Gastrojejunostomy tube is in place. Appendix is unremarkable and partially imaged. No obvious mass in the colon. Diverticulosis of the descending and sigmoid colon. No evidence of small-bowel obstruction. Vascular/Lymphatic: No abnormal retroperitoneal adenopathy. No evidence of aortic aneurysm. Other: No free fluid. Musculoskeletal: No vertebral compression deformity. IMPRESSION: Gastrojejunostomy tube is in place. Bibasilar pulmonary consolidation Possible gallstones.  Ultrasound is recommended. 5 mm hyperdense lesion in the right kidney. Consider MRI with and without contrast when feasible. Electronically Signed   By: Marybelle Killings M.D.   On: 01/10/2018 09:07   Dg Chest [facility]  1 View  Result Date: 01/11/2018 CLINICAL DATA:  Acute respiratory failure EXAM: PORTABLE CHEST 1 VIEW COMPARISON:  12/28/2017 FINDINGS: Stable multifocal patchy opacities in the left upper lobe and bilateral lower lobes, suggesting multifocal infection. Stable increased perihilar markings, suggesting mild interstitial edema. Associated small bilateral  pleural effusions, unchanged. Cardiomegaly. Tracheostomy in satisfactory position. IMPRESSION: Multifocal patchy opacities in the left upper lobe and bilateral lower lobes, suggesting multifocal pneumonia, grossly unchanged. Cardiomegaly with possible mild interstitial edema and small bilateral pleural effusions, unchanged. Electronically Signed   By: Julian Hy M.D.   On: 01/11/2018 16:24    Assessment/Plan Active Problems:   Paraesophageal hernia   Esophageal obstruction   GERD (gastroesophageal reflux disease)   COPD (chronic obstructive pulmonary disease) (HCC)   History of lung cancer   1. Acute on chronic restaurant failure with hypoxia patient is gradually advancing on weaning will continue with PMV and hopefully start capping trials 2. COPD clinically stable follow along 3. Esophageal obstruction status post surgical intervention 4. Paraesophageal hernia stable at this time   I have personally seen and evaluated the patient, evaluated laboratory and imaging results, formulated the assessment and plan and placed orders. The Patient requires high complexity decision making for assessment and support.  Case was discussed on Rounds with the Respiratory Therapy Staff  Allyne Gee, MD [facility] Pulmonary Critical Care Medicine Sleep Medicine

## 2018-01-13 ENCOUNTER — Other Ambulatory Visit (HOSPITAL_COMMUNITY): Payer: Self-pay

## 2018-01-13 DIAGNOSIS — J9621 Acute and chronic respiratory failure with hypoxia: Secondary | ICD-10-CM | POA: Diagnosis not present

## 2018-01-13 DIAGNOSIS — J449 Chronic obstructive pulmonary disease, unspecified: Secondary | ICD-10-CM | POA: Diagnosis not present

## 2018-01-13 DIAGNOSIS — K222 Esophageal obstruction: Secondary | ICD-10-CM | POA: Diagnosis not present

## 2018-01-13 DIAGNOSIS — K219 Gastro-esophageal reflux disease without esophagitis: Secondary | ICD-10-CM | POA: Diagnosis not present

## 2018-01-13 LAB — CULTURE, RESPIRATORY W GRAM STAIN

## 2018-01-13 LAB — CBC
HEMATOCRIT: 25.9 % — AB (ref 36.0–46.0)
Hemoglobin: 7.7 g/dL — ABNORMAL LOW (ref 12.0–15.0)
MCH: 28 pg (ref 26.0–34.0)
MCHC: 29.7 g/dL — ABNORMAL LOW (ref 30.0–36.0)
MCV: 94.2 fL (ref 78.0–100.0)
PLATELETS: 216 10*3/uL (ref 150–400)
RBC: 2.75 MIL/uL — ABNORMAL LOW (ref 3.87–5.11)
RDW: 17.8 % — ABNORMAL HIGH (ref 11.5–15.5)
WBC: 8 10*3/uL (ref 4.0–10.5)

## 2018-01-13 LAB — CULTURE, RESPIRATORY: CULTURE: NORMAL

## 2018-01-13 LAB — POTASSIUM: POTASSIUM: 3.7 mmol/L (ref 3.5–5.1)

## 2018-01-13 NOTE — Progress Notes (Signed)
Pulmonary [place]   PULMONARY SERVICE  PROGRESS NOTE  Date of Service: January 13, 2018  Sherry Bennett  OVF:[id number]  DOB: [birth year]   DOA: 01/04/2018  Referring Physician: Merton Border, MD  HPI: Sherry Bennett is a 77 y.o. female seen for follow up of Acute on Chronic Respiratory Failure.  Patient is on her/T-piece.  Per family she has been a little bit more lethargic than normal.  Right now she was awake.  Looked at her labs only thing other than a hemoglobin of 7.7 labs appear to be fairly normal.  Last chest x-ray had shown pleural effusions suggested getting a follow-up chest x-ray on her  Medications: Reviewed on Rounds  Physical Exam:  Vitals: Temperature 97.7 pulse 83 respiratory 34 blood pressure 137/61 saturations 98%  Ventilator Settings personalized T collar FiO2 28% using the PMV  . General: Comfortable at this time . Eyes: Grossly normal lids, irises & conjunctiva . ENT: grossly tongue is normal . Neck: no obvious mass . Cardiovascular: S1-S2 normal no gallop . Respiratory: Expansion is equal no rhonchi . Abdomen: Soft nontender . Skin: no rash seen on limited exam . Musculoskeletal: not rigid . Psychiatric:unable to assess . Neurologic: no seizure no involuntary movements         Labs on Admission:  Basic Metabolic Panel: Recent Labs  Lab 01/08/18 0515 01/11/18 0535 01/12/18 0623 01/13/18 0737  NA 143 142  --   --   K 3.4* 2.9* 3.3* 3.7  CL 104 100*  --   --   CO2 30 32  --   --   GLUCOSE 105* 127*  --   --   BUN 30* 18  --   --   CREATININE 0.80 0.70  --   --   CALCIUM 7.9* 8.4*  --   --   MG 2.0  --   --   --     Liver Function Tests: No results for input(s): AST, ALT, ALKPHOS, BILITOT, PROT, ALBUMIN in the last 168 hours. No results for input(s): LIPASE, AMYLASE in the last 168 hours. No results for input(s): AMMONIA in the last 168 hours.  CBC: Recent Labs  Lab 01/13/18 0737  WBC 8.0   HGB 7.7*  HCT 25.9*  MCV 94.2  PLT 216    Cardiac Enzymes: No results for input(s): CKTOTAL, CKMB, CKMBINDEX, TROPONINI in the last 168 hours.  BNP (last 3 results) No results for input(s): BNP in the last 8760 hours.  ProBNP (last 3 results) No results for input(s): PROBNP in the last 8760 hours.  Radiological Exams on Admission: Ct Abdomen Wo Contrast  Result Date: 01/10/2018 CLINICAL DATA:  Gastrostomy tube EXAM: CT ABDOMEN AND PELVIS WITHOUT CONTRAST TECHNIQUE: Multidetector CT imaging of the abdomen and pelvis was performed following the standard protocol without IV contrast. COMPARISON:  None. FINDINGS: Lower chest: There is extensive consolidation towards both lung bases. Large hiatal hernia is present. Hepatobiliary: High-density material is layering in the gallbladder. Liver is unremarkable. Pancreas: Unremarkable Spleen: Unremarkable Adrenals/Urinary Tract: No hydronephrosis. Hyperdense space-occupying lesion in the anterior right kidney measures 5 mm in size. Adrenal glands are within normal limits. Left kidney is unremarkable. Stomach/Bowel: Gastrojejunostomy tube is in place. Appendix is unremarkable and partially imaged. No obvious mass in the colon. Diverticulosis of the descending and sigmoid colon. No evidence of small-bowel obstruction. Vascular/Lymphatic: No abnormal retroperitoneal adenopathy. No evidence of aortic aneurysm. Other: No free fluid. Musculoskeletal: No vertebral compression deformity.  IMPRESSION: Gastrojejunostomy tube is in place. Bibasilar pulmonary consolidation Possible gallstones.  Ultrasound is recommended. 5 mm hyperdense lesion in the right kidney. Consider MRI with and without contrast when feasible. Electronically Signed   By: Marybelle Killings M.D.   On: 01/10/2018 09:07   Dg Chest Port 1 View  Result Date: 01/11/2018 CLINICAL DATA:  Acute respiratory failure EXAM: PORTABLE CHEST 1 VIEW COMPARISON:  12/28/2017 FINDINGS: Stable multifocal patchy opacities  in the left upper lobe and bilateral lower lobes, suggesting multifocal infection. Stable increased perihilar markings, suggesting mild interstitial edema. Associated small bilateral pleural effusions, unchanged. Cardiomegaly. Tracheostomy in satisfactory position. IMPRESSION: Multifocal patchy opacities in the left upper lobe and bilateral lower lobes, suggesting multifocal pneumonia, grossly unchanged. Cardiomegaly with possible mild interstitial edema and small bilateral pleural effusions, unchanged. Electronically Signed   By: Julian Hy M.D.   On: 01/11/2018 16:24    Assessment/Plan Active Problems:   Paraesophageal hernia   Esophageal obstruction   GERD (gastroesophageal reflux disease)   COPD (chronic obstructive pulmonary disease) (HCC)   History of lung cancer   1. Acute on chronic respiratory failure with hypoxia patient continues to wean on T collar which will be continued will continue also with the PMV.  If she becomes more lethargic would consider doing blood gas on her appropriate now she actually looked pretty good 2. Paraesophageal hernia status post repair 3. GERD stable 4. COPD advanced disease will continue to monitor 5. History of lung cancer   I have personally seen and evaluated the patient, evaluated laboratory and imaging results, formulated the assessment and plan and placed orders. The Patient requires high complexity decision making for assessment and support.  Case was discussed on Rounds with the Respiratory Therapy Staff  Allyne Gee, MD [facility] Pulmonary Critical Care Medicine Sleep Medicine

## 2018-01-15 ENCOUNTER — Other Ambulatory Visit (HOSPITAL_COMMUNITY): Payer: Self-pay

## 2018-01-15 LAB — BLOOD GAS, ARTERIAL
ACID-BASE EXCESS: 13.8 mmol/L — AB (ref 0.0–2.0)
BICARBONATE: 38.6 mmol/L — AB (ref 20.0–28.0)
FIO2: 30
O2 Saturation: 94.9 %
PATIENT TEMPERATURE: 98.6
PCO2 ART: 54.4 mmHg — AB (ref 32.0–48.0)
PO2 ART: 72.8 mmHg — AB (ref 83.0–108.0)
pH, Arterial: 7.464 — ABNORMAL HIGH (ref 7.350–7.450)

## 2018-01-15 MED ORDER — IOPAMIDOL (ISOVUE-300) INJECTION 61%
INTRAVENOUS | Status: AC
  Filled 2018-01-15: qty 50

## 2018-01-16 DIAGNOSIS — J9621 Acute and chronic respiratory failure with hypoxia: Secondary | ICD-10-CM | POA: Diagnosis not present

## 2018-01-16 DIAGNOSIS — K222 Esophageal obstruction: Secondary | ICD-10-CM | POA: Diagnosis not present

## 2018-01-16 DIAGNOSIS — J449 Chronic obstructive pulmonary disease, unspecified: Secondary | ICD-10-CM | POA: Diagnosis not present

## 2018-01-16 DIAGNOSIS — K219 Gastro-esophageal reflux disease without esophagitis: Secondary | ICD-10-CM | POA: Diagnosis not present

## 2018-01-16 NOTE — Progress Notes (Signed)
Sherry Bennett   Sherry SERVICE  PROGRESS NOTE  Date of Service: 01/16/2018  Sherry Bennett  VOZ:[id number]  DOB: 1941/04/09   DOA: 01/04/2018  Referring Physician: Merton Border, MD  HPI: Sherry Bennett is a 78 y.o. female seen for follow up of Acute on Chronic Respiratory Failure.  Right now patient is on T, has been on 30% oxygen.  She was not able to tolerate PMV with the smaller trach switched over to a size 6 trach.  Hopefully we can work towards decannulation.  She may be a case where decannulation may need to be attempted as she likely has a small airway to begin with  Medications: Reviewed on Rounds  Physical Exam:  Vitals: Temperature 98.4 pulse 61 respiratory rate 10 blood pressure 137/65 saturations 94%  Ventilator Settings are summarized T collar FiO2 30%  . General: Comfortable at this time . Eyes: Grossly normal lids, irises & conjunctiva . ENT: grossly tongue is normal . Neck: no obvious mass . Cardiovascular: S1-S2 normal no gallop or rub . Respiratory: No rhonchi expansion equal . Abdomen: Soft nondistended . Skin: no rash seen on limited exam . Musculoskeletal: not rigid . Psychiatric:unable to assess . Neurologic: no seizure no involuntary movements         Labs on Admission:  Basic Metabolic Panel: Recent Labs  Lab 01/11/18 0535 01/12/18 0623 01/13/18 0737  NA 142  --   --   K 2.9* 3.3* 3.7  CL 100*  --   --   CO2 32  --   --   GLUCOSE 127*  --   --   BUN 18  --   --   CREATININE 0.70  --   --   CALCIUM 8.4*  --   --     Liver Function Tests: No results for input(s): AST, ALT, ALKPHOS, BILITOT, PROT, ALBUMIN in the last 168 hours. No results for input(s): LIPASE, AMYLASE in the last 168 hours. No results for input(s): AMMONIA in the last 168 hours.  CBC: Recent Labs  Lab 01/13/18 0737  WBC 8.0  HGB 7.7*  HCT 25.9*  MCV 94.2  PLT 216    Cardiac Enzymes: No results for  input(s): CKTOTAL, CKMB, CKMBINDEX, TROPONINI in the last 168 hours.  BNP (last 3 results) No results for input(s): BNP in the last 8760 hours.  ProBNP (last 3 results) No results for input(s): PROBNP in the last 8760 hours.  Radiological Exams on Admission: Dg Chest Port 1 View  Result Date: 01/13/2018 CLINICAL DATA:  Follow-up pleural effusion. EXAM: PORTABLE CHEST 1 VIEW COMPARISON:  01/11/2018.  01/05/2018 FINDINGS: Tracheostomy remains in place. Persistent bilateral density consistent with effusions and atelectasis. Certainly, coexistent pneumonia not excluded. Radiographic worsening over the last 2 exams. IMPRESSION: Persistent worsening of bilateral density. Findings consistent with effusions and atelectasis, possibly with coexistent pneumonia. Electronically Signed   By: Nelson Chimes M.D.   On: 01/13/2018 16:41   Dg Abd Portable 1v  Result Date: 01/15/2018 CLINICAL DATA:  Jejunostomy tube evaluation. EXAM: PORTABLE ABDOMEN - 1 VIEW COMPARISON:  None. FINDINGS: Contrast has been injected through the jejunostomy tube with contrast noted in the lumen of the jejunum. No evidence of leak. No other abnormalities. IMPRESSION: Contrast injected through the jejunostomy tube enters the jejunum with no evidence of leak. Electronically Signed   By: Dorise Bullion III M.D   On: 01/15/2018 14:29    Assessment/Plan Active Problems:   Paraesophageal hernia  Esophageal obstruction   GERD (gastroesophageal reflux disease)   COPD (chronic obstructive Sherry disease) (HCC)   History of lung cancer   1. Acute on chronic respiratory failure with hypoxia now is off the ventilator she is been weaned down to T collar at this stage we will continue with T collar and FiO2 of 30%.  Need to assess potential for decannulation.  I have discussed this with the son at the bedside.  He states he wants to make that final decision about taking the trach out. 2. Esophageal obstruction clinically stable  resolved 3. COPD stable at this time 4. History lung cancer follow-up as outpatient 5. Paraesophageal hernia stable   I have personally seen and evaluated the patient, evaluated laboratory and imaging results, formulated the assessment and plan and placed orders. The Patient requires high complexity decision making for assessment and support.  Case was discussed on Rounds with the Respiratory Therapy Staff  Allyne Gee, MD [facility] Critical Care Medicine Sleep Medicine

## 2018-01-17 DIAGNOSIS — K222 Esophageal obstruction: Secondary | ICD-10-CM | POA: Diagnosis not present

## 2018-01-17 DIAGNOSIS — K219 Gastro-esophageal reflux disease without esophagitis: Secondary | ICD-10-CM | POA: Diagnosis not present

## 2018-01-17 DIAGNOSIS — J449 Chronic obstructive pulmonary disease, unspecified: Secondary | ICD-10-CM | POA: Diagnosis not present

## 2018-01-17 DIAGNOSIS — J9621 Acute and chronic respiratory failure with hypoxia: Secondary | ICD-10-CM | POA: Diagnosis not present

## 2018-01-17 LAB — BASIC METABOLIC PANEL
Anion gap: 9 (ref 5–15)
BUN: 33 mg/dL — AB (ref 6–20)
CHLORIDE: 96 mmol/L — AB (ref 101–111)
CO2: 34 mmol/L — ABNORMAL HIGH (ref 22–32)
CREATININE: 0.71 mg/dL (ref 0.44–1.00)
Calcium: 8.4 mg/dL — ABNORMAL LOW (ref 8.9–10.3)
GFR calc Af Amer: >60 mL/min (ref >60)
GFR calc non Af Amer: >60 mL/min (ref >60)
GLUCOSE: 111 mg/dL — AB (ref 65–99)
POTASSIUM: 2.7 mmol/L — AB (ref 3.5–5.1)
Sodium: 139 mmol/L (ref 135–145)

## 2018-01-17 LAB — CBC
HEMATOCRIT: 24.8 % — AB (ref 36.0–46.0)
Hemoglobin: 7.4 g/dL — ABNORMAL LOW (ref 12.0–15.0)
MCH: 27.5 pg (ref 26.0–34.0)
MCHC: 29.8 g/dL — AB (ref 30.0–36.0)
MCV: 92.2 fL (ref 78.0–100.0)
Platelets: 179 10*3/uL (ref 150–400)
RBC: 2.69 MIL/uL — ABNORMAL LOW (ref 3.87–5.11)
RDW: 17 % — AB (ref 11.5–15.5)
WBC: 5.9 10*3/uL (ref 4.0–10.5)

## 2018-01-17 LAB — MAGNESIUM: Magnesium: 2 mg/dL (ref 1.7–2.4)

## 2018-01-17 NOTE — Progress Notes (Signed)
Pulmonary [place]   PULMONARY SERVICE  PROGRESS NOTE  Date of Service: 01/17/2018  Domingue Coltrain Mahoney  YBO:[id number]  DOB: 16-Dec-1940   DOA: 01/04/2018  Referring Physician: Merton Border, MD  HPI: Sherry Bennett is a 77 y.o. female seen for follow up of Acute on Chronic Respiratory Failure.  Patient remains on aerosolized T collar has a size 6 XLT trach in place seems to be tolerating that fairly well.  Good saturations are noted.  Secretions are fair to moderate  Medications: Reviewed on Rounds  Physical Exam:  Vitals:  Temperature 96.5 degrees pulse 56 respiratory 19 blood pressure 113/51 saturations 95%  Ventilator Settings  Aerosolized T collar FiO2 28%  . General: Comfortable at this time . Eyes: Grossly normal lids, irises & conjunctiva . ENT: grossly tongue is normal . Neck: no obvious mass . Cardiovascular:  S1-S2 normal gallop or rub . Respiratory:  No rhonchi expansion is equal . Abdomen:  Soft nondistended . Skin: no rash seen on limited exam . Musculoskeletal: not rigid . Psychiatric:unable to assess . Neurologic: no seizure no involuntary movements         Labs on Admission:  Basic Metabolic Panel: Recent Labs  Lab 01/11/18 0535 01/12/18 0623 01/13/18 0737 01/17/18 1529  NA 142  --   --  139  K 2.9* 3.3* 3.7 2.7*  CL 100*  --   --  96*  CO2 32  --   --  34*  GLUCOSE 127*  --   --  111*  BUN 18  --   --  33*  CREATININE 0.70  --   --  0.71  CALCIUM 8.4*  --   --  8.4*  MG  --   --   --  2.0    Liver Function Tests: No results for input(s): AST, ALT, ALKPHOS, BILITOT, PROT, ALBUMIN in the last 168 hours. No results for input(s): LIPASE, AMYLASE in the last 168 hours. No results for input(s): AMMONIA in the last 168 hours.  CBC: Recent Labs  Lab 01/13/18 0737 01/17/18 1529  WBC 8.0 5.9  HGB 7.7* 7.4*  HCT 25.9* 24.8*  MCV 94.2 92.2  PLT 216 179    Cardiac Enzymes: No results for input(s):  CKTOTAL, CKMB, CKMBINDEX, TROPONINI in the last 168 hours.  BNP (last 3 results) No results for input(s): BNP in the last 8760 hours.  ProBNP (last 3 results) No results for input(s): PROBNP in the last 8760 hours.  Radiological Exams on Admission: Dg Abd Portable 1v  Result Date: 01/15/2018 CLINICAL DATA:  Jejunostomy tube evaluation. EXAM: PORTABLE ABDOMEN - 1 VIEW COMPARISON:  None. FINDINGS: Contrast has been injected through the jejunostomy tube with contrast noted in the lumen of the jejunum. No evidence of leak. No other abnormalities. IMPRESSION: Contrast injected through the jejunostomy tube enters the jejunum with no evidence of leak. Electronically Signed   By: Dorise Bullion III M.D   On: 01/15/2018 14:29    Assessment/Plan Active Problems:   Paraesophageal hernia   Esophageal obstruction   GERD (gastroesophageal reflux disease)   COPD (chronic obstructive pulmonary disease) (HCC)   History of lung cancer   1.  acute on chronic Respiratory failure with hypoxia will continue with aerosolized T collar at this time.  The trach is been changed as mention above and is tolerating the 6 XLT trach now better we will continue to monitor closely 2. Esophageal obstruction treated resolved will continue to monitor  3. GERD now stable Will monitor 4. COPD advanced disease we will continue to follow along   I have spoken to the son and the son is wanting to take the chance of removing the tracheostomy and he does understand that this could lead to possible failure and having to be reintubated.  I have personally seen and evaluated the patient, evaluated laboratory and imaging results, formulated the assessment and plan and placed orders. The Patient requires high complexity decision making for assessment and support.  Case was discussed on Rounds with the Respiratory Therapy Staff  Allyne Gee, MD [facility] Pulmonary Critical Care Medicine Sleep Medicine

## 2018-01-18 ENCOUNTER — Other Ambulatory Visit (HOSPITAL_COMMUNITY): Payer: Self-pay

## 2018-01-18 DIAGNOSIS — K219 Gastro-esophageal reflux disease without esophagitis: Secondary | ICD-10-CM | POA: Diagnosis not present

## 2018-01-18 DIAGNOSIS — K222 Esophageal obstruction: Secondary | ICD-10-CM | POA: Diagnosis not present

## 2018-01-18 DIAGNOSIS — J449 Chronic obstructive pulmonary disease, unspecified: Secondary | ICD-10-CM | POA: Diagnosis not present

## 2018-01-18 DIAGNOSIS — J9621 Acute and chronic respiratory failure with hypoxia: Secondary | ICD-10-CM | POA: Diagnosis not present

## 2018-01-18 LAB — POTASSIUM: POTASSIUM: 3.8 mmol/L (ref 3.5–5.1)

## 2018-01-18 NOTE — Progress Notes (Signed)
Pulmonary [place]   PULMONARY SERVICE  PROGRESS NOTE  Date of Service: 01/18/2018  Sherry Bennett  [id number]:[id number]  DOB: 18-Sep-1941   DOA: 01/04/2018  Referring Physician: Merton Border, MD  HPI: Sherry Bennett is a 77 y.o. female seen for follow up of Acute on Chronic Respiratory Failure.  Patient continues on aerosolized T, currently is on 28% oxygen.  Waiting to hear back from the son regarding possibility of decannulation.  She is actually been doing fairly well off the ventilator secretions are also under reasonable control at this time and she will hopefully do fine decannulated however he does understand that if she fails we will have to reintubate her and he is in agreement with that but will make a final decision soon  Medications: Reviewed on Rounds  Physical Exam:  Vitals: Temperature 96.2 pulse 64 respiratory rate 13 blood pressure 130/66 saturation 95%  Ventilator Settings aerosolized T collar 28% FiO2  . General: Comfortable at this time . Eyes: Grossly normal lids, irises & conjunctiva . ENT: grossly tongue is normal . Neck: no obvious mass . Cardiovascular: S1-S2 normal no gallop . Respiratory: No rhonchi expansion equal . Abdomen: Soft and nontender . Skin: no rash seen on limited exam . Musculoskeletal: not rigid . Psychiatric:unable to assess . Neurologic: no seizure no involuntary movements         Labs on Admission:  Basic Metabolic Panel: Recent Labs  Lab 01/12/18 0623 01/13/18 0737 01/17/18 1529 01/18/18 0613  NA  --   --  139  --   K 3.3* 3.7 2.7* 3.8  CL  --   --  96*  --   CO2  --   --  34*  --   GLUCOSE  --   --  111*  --   BUN  --   --  33*  --   CREATININE  --   --  0.71  --   CALCIUM  --   --  8.4*  --   MG  --   --  2.0  --     Liver Function Tests: No results for input(s): AST, ALT, ALKPHOS, BILITOT, PROT, ALBUMIN in the last 168 hours. No results for input(s): LIPASE, AMYLASE in  the last 168 hours. No results for input(s): AMMONIA in the last 168 hours.  CBC: Recent Labs  Lab 01/13/18 0737 01/17/18 1529  WBC 8.0 5.9  HGB 7.7* 7.4*  HCT 25.9* 24.8*  MCV 94.2 92.2  PLT 216 179    Cardiac Enzymes: No results for input(s): CKTOTAL, CKMB, CKMBINDEX, TROPONINI in the last 168 hours.  BNP (last 3 results) No results for input(s): BNP in the last 8760 hours.  ProBNP (last 3 results) No results for input(s): PROBNP in the last 8760 hours.  Radiological Exams on Admission: Dg Abd Portable 1v  Result Date: 01/15/2018 CLINICAL DATA:  Jejunostomy tube evaluation. EXAM: PORTABLE ABDOMEN - 1 VIEW COMPARISON:  None. FINDINGS: Contrast has been injected through the jejunostomy tube with contrast noted in the lumen of the jejunum. No evidence of leak. No other abnormalities. IMPRESSION: Contrast injected through the jejunostomy tube enters the jejunum with no evidence of leak. Electronically Signed   By: Dorise Bullion III M.D   On: 01/15/2018 14:29    Assessment/Plan Active Problems:   Paraesophageal hernia   Esophageal obstruction   GERD (gastroesophageal reflux disease)   COPD (chronic obstructive pulmonary disease) ([place])   History of lung cancer  1. Acute on chronic respiratory failure with hypoxia patient is doing fine on the T collar we will continue with current oxygen settings continue with pulmonary toilet awaiting word from the son regarding whether to proceed with decannulation attempt 2. COPD severe disease will continue with current management 3. History of lung cancer stable at this time 4. Esophageal obstruction resolved we will continue to monitor 5. Paraesophageal hernia treated we will monitor   I have personally seen and evaluated the patient, evaluated laboratory and imaging results, formulated the assessment and plan and placed orders. The Patient requires high complexity decision making for assessment and support.  Case was discussed on  Rounds with the Respiratory Therapy Staff  Allyne Gee, MD [facility] Pulmonary Critical Care Medicine Sleep Medicine

## 2018-01-19 DIAGNOSIS — J449 Chronic obstructive pulmonary disease, unspecified: Secondary | ICD-10-CM | POA: Diagnosis not present

## 2018-01-19 DIAGNOSIS — J9621 Acute and chronic respiratory failure with hypoxia: Secondary | ICD-10-CM | POA: Diagnosis not present

## 2018-01-19 DIAGNOSIS — K222 Esophageal obstruction: Secondary | ICD-10-CM | POA: Diagnosis not present

## 2018-01-19 DIAGNOSIS — K219 Gastro-esophageal reflux disease without esophagitis: Secondary | ICD-10-CM | POA: Diagnosis not present

## 2018-01-19 NOTE — Progress Notes (Signed)
[name]   [facility] SERVICE  PROGRESS NOTE  Date of Service: 01/19/2018  Sherry Bennett  [id number]  DOB: October 10, 1941   DOA: 01/04/2018  Referring Physician: Merton Border, MD  HPI: Sherry Bennett is a 77 y.o. female seen for follow up of Acute on Chronic Respiratory Failure.  She is doing very well has the PMV on at this time she has been on T collar has been on 20% oxygen.  Medications: Reviewed on Rounds  Physical Exam:  Vitals: Temperature 97.1 pulse 88 Mr. rate 18 blood pressure 102/50 saturations 95%  Ventilator Settings aerosolized T collar FiO2 20%  . General: Comfortable at this time . Eyes: Grossly normal lids, irises & conjunctiva . ENT: grossly tongue is normal . Neck: no obvious mass . Cardiovascular: S1-S2 normal no gallop or rub . Respiratory: No rhonchi expansion is equal . Abdomen: Soft and nontender . Skin: no rash seen on limited exam . Musculoskeletal: not rigid . Psychiatric:unable to assess . Neurologic: no seizure no involuntary movements         Labs on Admission:  Basic Metabolic Panel: Recent Labs  Lab 01/13/18 0737 01/17/18 1529 01/18/18 0613  NA  --  139  --   K 3.7 2.7* 3.8  CL  --  96*  --   CO2  --  34*  --   GLUCOSE  --  111*  --   BUN  --  33*  --   CREATININE  --  0.71  --   CALCIUM  --  8.4*  --   MG  --  2.0  --     Liver Function Tests: No results for input(s): AST, ALT, ALKPHOS, BILITOT, PROT, ALBUMIN in the last 168 hours. No results for input(s): LIPASE, AMYLASE in the last 168 hours. No results for input(s): AMMONIA in the last 168 hours.  CBC: Recent Labs  Lab 01/13/18 0737 01/17/18 1529  WBC 8.0 5.9  HGB 7.7* 7.4*  HCT 25.9* 24.8*  MCV 94.2 92.2  PLT 216 179    Cardiac Enzymes: No results for input(s): CKTOTAL, CKMB, CKMBINDEX, TROPONINI in the last 168 hours.  BNP (last 3 results) No results for input(s): BNP in the last 8760  hours.  ProBNP (last 3 results) No results for input(s): PROBNP in the last 8760 hours.  Radiological Exams on Admission: Ct Head Wo Contrast  Result Date: 01/18/2018 CLINICAL DATA:  Acute altered mental status. EXAM: CT HEAD WITHOUT CONTRAST TECHNIQUE: Contiguous axial images were obtained from the base of the skull through the vertex without intravenous contrast. COMPARISON:  None. FINDINGS: Brain: Mild generalized age related parenchymal atrophy with commensurate dilatation of the ventricles and sulci. No hydrocephalus. Chronic small vessel ischemic changes noted within the bilateral periventricular white matter regions. 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 No mass, hemorrhage, edema or other evidence of acute parenchymal abnormality. No extra-axial hemorrhage. Vascular: There are chronic calcified atherosclerotic changes of the large vessels at the skull base. No unexpected hyperdense vessel. Skull: Normal. Negative for fracture or focal lesion. Sinuses/Orbits: No acute finding. Other: None. IMPRESSION: 1. No acute findings.  No intracranial mass, hemorrhage or edema. 2. Chronic small-vessel ischemic changes in the white matter. Electronically Signed   By: Franki Cabot M.D.   On: 01/18/2018 12:35   Dg Abd Portable 1v  Result Date: 01/15/2018 CLINICAL DATA:  Jejunostomy tube evaluation. EXAM: PORTABLE ABDOMEN - 1 VIEW COMPARISON:  None. FINDINGS: Contrast  has been injected through the jejunostomy tube with contrast noted in the lumen of the jejunum. No evidence of leak. No other abnormalities. IMPRESSION: Contrast injected through the jejunostomy tube enters the jejunum with no evidence of leak. Electronically Signed   By: Dorise Bullion III M.D   On: 01/15/2018 14:29    Assessment/Plan Active Problems:   Paraesophageal hernia   Esophageal obstruction   GERD (gastroesophageal reflux disease)   COPD (chronic obstructive Bennett disease) ([place])   History of  lung cancer   1. Acute on chronic respiratory failure with hypoxia she continues to do well and will start capping trials today 2. GERD stable at this time will follow along 3. Esophageal obstruction treated we will monitor 4. COPD clinically improving 5. History of lung cancer follow-up as outpatient   I have personally seen and evaluated the patient, evaluated laboratory and imaging results, formulated the assessment and plan and placed orders. The Patient requires high complexity decision making for assessment and support.  Case was discussed on Rounds with the Respiratory Therapy Staff  Allyne Gee, MD [facility] Critical Care Medicine Sleep Medicine

## 2018-01-20 DIAGNOSIS — K222 Esophageal obstruction: Secondary | ICD-10-CM | POA: Diagnosis not present

## 2018-01-20 DIAGNOSIS — K219 Gastro-esophageal reflux disease without esophagitis: Secondary | ICD-10-CM | POA: Diagnosis not present

## 2018-01-20 DIAGNOSIS — J9621 Acute and chronic respiratory failure with hypoxia: Secondary | ICD-10-CM | POA: Diagnosis not present

## 2018-01-20 DIAGNOSIS — J449 Chronic obstructive pulmonary disease, unspecified: Secondary | ICD-10-CM | POA: Diagnosis not present

## 2018-01-20 NOTE — Progress Notes (Signed)
Pulmonary [place]   PULMONARY SERVICE  PROGRESS NOTE  Date of Service: 01/20/2018  Sherry Bennett  [id number]:[id number]  DOB: 03/08/1941   DOA: 01/04/2018  Referring Physician: Merton Border, MD  HPI: Sherry Bennett is a 77 y.o. female seen for follow up of Acute on Chronic Respiratory Failure.  She is doing well she has been capped no distress is noted.  Medications: Reviewed on Rounds  Physical Exam:  Vitals:   Temperature 97.0 degrees pulse 74 respiratory 15 blood pressure 90/40 saturations 99%  Ventilator Settings  Capped off the ventilator  . General: Comfortable at this time . Eyes: Grossly normal lids, irises & conjunctiva . ENT: grossly tongue is normal . Neck: no obvious mass . Cardiovascular:  S1-S2 normal no gallop . Respiratory:  No rhonchi expansion is equal . Abdomen:  Soft nontender . Skin: no rash seen on limited exam . Musculoskeletal: not rigid . Psychiatric:unable to assess . Neurologic: no seizure no involuntary movements         Labs on Admission:  Basic Metabolic Panel: Recent Labs  Lab 01/17/18 1529 01/18/18 0613  NA 139  --   K 2.7* 3.8  CL 96*  --   CO2 34*  --   GLUCOSE 111*  --   BUN 33*  --   CREATININE 0.71  --   CALCIUM 8.4*  --   MG 2.0  --     Liver Function Tests: No results for input(s): AST, ALT, ALKPHOS, BILITOT, PROT, ALBUMIN in the last 168 hours. No results for input(s): LIPASE, AMYLASE in the last 168 hours. No results for input(s): AMMONIA in the last 168 hours.  CBC: Recent Labs  Lab 01/17/18 1529  WBC 5.9  HGB 7.4*  HCT 24.8*  MCV 92.2  PLT 179    Cardiac Enzymes: No results for input(s): CKTOTAL, CKMB, CKMBINDEX, TROPONINI in the last 168 hours.  BNP (last 3 results) No results for input(s): BNP in the last 8760 hours.  ProBNP (last 3 results) No results for input(s): PROBNP in the last 8760 hours.  Radiological Exams on Admission: Ct Head Wo  Contrast  Result Date: 01/18/2018 CLINICAL DATA:  Acute altered mental status. EXAM: CT HEAD WITHOUT CONTRAST TECHNIQUE: Contiguous axial images were obtained from the base of the skull through the vertex without intravenous contrast. COMPARISON:  None. FINDINGS: Brain: Mild generalized age related parenchymal atrophy with commensurate dilatation of the ventricles and sulci. No hydrocephalus. Chronic small vessel ischemic changes noted within the bilateral periventricular white matter regions. 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 No mass, hemorrhage, edema or other evidence of acute parenchymal abnormality. No extra-axial hemorrhage. Vascular: There are chronic calcified atherosclerotic changes of the large vessels at the skull base. No unexpected hyperdense vessel. Skull: Normal. Negative for fracture or focal lesion. Sinuses/Orbits: No acute finding. Other: None. IMPRESSION: 1. No acute findings.  No intracranial mass, hemorrhage or edema. 2. Chronic small-vessel ischemic changes in the white matter. Electronically Signed   By: Franki Cabot M.D.   On: 01/18/2018 12:35    Assessment/Plan Active Problems:   Paraesophageal hernia   Esophageal obstruction   GERD (gastroesophageal reflux disease)   COPD (chronic obstructive pulmonary disease) (HCC)   History of lung cancer   1.  acute on chronic Respiratory failure with hypoxia will continue with full supportive care and continue with capping trials as ordered.  Need to discuss with the son regarding proceeding toward decannulation 2.  GERD stable at this time  3. COPD under control 4. History of lung cancer at baseline   I have personally seen and evaluated the patient, evaluated laboratory and imaging results, formulated the assessment and plan and placed orders. The Patient requires high complexity decision making for assessment and support.  Case was discussed on Rounds with the Respiratory Therapy  Staff  Allyne Gee, MD [facility] Pulmonary Critical Care Medicine Sleep Medicine

## 2018-01-21 DIAGNOSIS — K219 Gastro-esophageal reflux disease without esophagitis: Secondary | ICD-10-CM | POA: Diagnosis not present

## 2018-01-21 DIAGNOSIS — K222 Esophageal obstruction: Secondary | ICD-10-CM | POA: Diagnosis not present

## 2018-01-21 DIAGNOSIS — J9621 Acute and chronic respiratory failure with hypoxia: Secondary | ICD-10-CM | POA: Diagnosis not present

## 2018-01-21 DIAGNOSIS — J449 Chronic obstructive pulmonary disease, unspecified: Secondary | ICD-10-CM | POA: Diagnosis not present

## 2018-01-21 LAB — BASIC METABOLIC PANEL
ANION GAP: 9 (ref 5–15)
BUN: 31 mg/dL — AB (ref 6–20)
CALCIUM: 8.1 mg/dL — AB (ref 8.9–10.3)
CO2: 35 mmol/L — ABNORMAL HIGH (ref 22–32)
Chloride: 94 mmol/L — ABNORMAL LOW (ref 101–111)
Creatinine, Ser: 0.75 mg/dL (ref 0.44–1.00)
GFR calc Af Amer: >60 mL/min (ref >60)
GLUCOSE: 92 mg/dL (ref 65–99)
Potassium: 3.8 mmol/L (ref 3.5–5.1)
Sodium: 138 mmol/L (ref 135–145)

## 2018-01-21 NOTE — Progress Notes (Signed)
Pulmonary [place]   PULMONARY SERVICE  PROGRESS NOTE  Date of Service: 01/21/2018  Yelena Metzer Mendez  [id number]  DOB: August 24, 1941   DOA: 01/04/2018  Referring Physician: Merton Border, MD  HPI: Nguyet Mercer Connell is a 77 y.o. female seen for follow up of Acute on Chronic Respiratory Failure.  Patient is Right now currently on 2 L doing fairly well.  Patient's family was present in the room and they were updated  Medications: Reviewed on Rounds  Physical Exam:  Vitals: Temperature 96.3 pulse 66 respiratory rate 11 blood pressure 108/49 saturations 100%  Ventilator Settings On 2 L nasal cannula  . General: Comfortable at this time . Eyes: Grossly normal lids, irises & conjunctiva . ENT: grossly tongue is normal . Neck: no obvious mass . Cardiovascular: S1-S2 normal no gallop . Respiratory: No rhonchi expansion equal . Abdomen: Soft nontender . Skin: no rash seen on limited exam . Musculoskeletal: not rigid . Psychiatric:unable to assess . Neurologic: no seizure no involuntary movements         Labs on Admission:  Basic Metabolic Panel: Recent Labs  Lab 01/17/18 1529 01/18/18 0613 01/21/18 0756  NA 139  --  138  K 2.7* 3.8 3.8  CL 96*  --  94*  CO2 34*  --  35*  GLUCOSE 111*  --  92  BUN 33*  --  31*  CREATININE 0.71  --  0.75  CALCIUM 8.4*  --  8.1*  MG 2.0  --   --     Liver Function Tests: No results for input(s): AST, ALT, ALKPHOS, BILITOT, PROT, ALBUMIN in the last 168 hours. No results for input(s): LIPASE, AMYLASE in the last 168 hours. No results for input(s): AMMONIA in the last 168 hours.  CBC: Recent Labs  Lab 01/17/18 1529  WBC 5.9  HGB 7.4*  HCT 24.8*  MCV 92.2  PLT 179    Cardiac Enzymes: No results for input(s): CKTOTAL, CKMB, CKMBINDEX, TROPONINI in the last 168 hours.  BNP (last 3 results) No results for input(s): BNP in the last 8760 hours.  ProBNP (last 3 results) No results for  input(s): PROBNP in the last 8760 hours.  Radiological Exams on Admission: Ct Head Wo Contrast  Result Date: 01/18/2018 CLINICAL DATA:  Acute altered mental status. EXAM: CT HEAD WITHOUT CONTRAST TECHNIQUE: Contiguous axial images were obtained from the base of the skull through the vertex without intravenous contrast. COMPARISON:  None. FINDINGS: Brain: Mild generalized age related parenchymal atrophy with commensurate dilatation of the ventricles and sulci. No hydrocephalus. Chronic small vessel ischemic changes noted within the bilateral periventricular white matter regions. 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 No mass, hemorrhage, edema or other evidence of acute parenchymal abnormality. No extra-axial hemorrhage. Vascular: There are chronic calcified atherosclerotic changes of the large vessels at the skull base. No unexpected hyperdense vessel. Skull: Normal. Negative for fracture or focal lesion. Sinuses/Orbits: No acute finding. Other: None. IMPRESSION: 1. No acute findings.  No intracranial mass, hemorrhage or edema. 2. Chronic small-vessel ischemic changes in the white matter. Electronically Signed   By: Franki Cabot M.D.   On: 01/18/2018 12:35    Assessment/Plan Active Problems:   Paraesophageal hernia   Esophageal obstruction   GERD (gastroesophageal reflux disease)   COPD (chronic obstructive pulmonary disease) (HCC)   History of lung cancer   1. Acute on chronic respiratory failure with hypoxia patient will be continued on capping trials titrate  oxygen as needed 2. GERD stable at this time we will continue to follow 3. COPD at baseline 4. History of lung cancer stable follow-up as an outpatient   I have personally seen and evaluated the patient, evaluated laboratory and imaging results, formulated the assessment and plan and placed orders. The Patient requires high complexity decision making for assessment and support.  Case was discussed on  Rounds with the Respiratory Therapy Staff  Allyne Gee, MD [facility] Pulmonary Critical Care Medicine Sleep Medicine

## 2018-01-22 DIAGNOSIS — J449 Chronic obstructive pulmonary disease, unspecified: Secondary | ICD-10-CM | POA: Diagnosis not present

## 2018-01-22 DIAGNOSIS — J9621 Acute and chronic respiratory failure with hypoxia: Secondary | ICD-10-CM | POA: Diagnosis not present

## 2018-01-22 DIAGNOSIS — K222 Esophageal obstruction: Secondary | ICD-10-CM | POA: Diagnosis not present

## 2018-01-22 DIAGNOSIS — K219 Gastro-esophageal reflux disease without esophagitis: Secondary | ICD-10-CM | POA: Diagnosis not present

## 2018-01-22 NOTE — Progress Notes (Signed)
Pulmonary [place]   PULMONARY SERVICE  PROGRESS NOTE  Date of Service: 01/22/2018  Sherry Bennett  [id number]  DOB: November 06, 1940   DOA: 01/04/2018  Referring Physician: Merton Border, MD  HPI: Sherry Bennett is a 77 y.o. female seen for follow up of Acute on Chronic Respiratory Failure.  Patient is At this time has been on 2 L nasal cannula.  Good saturations are noted.  Medications: Reviewed on Rounds  Physical Exam:  Vitals: Temperature 97.6 pulse 76 respiratory rate 11 blood pressure 140/51 saturations 97%  Ventilator Settings off of the ventilator on capping trials  . General: Comfortable at this time . Eyes: Grossly normal lids, irises & conjunctiva . ENT: grossly tongue is normal . Neck: no obvious mass . Cardiovascular: S1-S2 normal no gallop . Respiratory: No rhonchi rales expansion equal . Abdomen: Soft nontender . Skin: no rash seen on limited exam . Musculoskeletal: not rigid . Psychiatric:unable to assess . Neurologic: no seizure no involuntary movements         Labs on Admission:  Basic Metabolic Panel: Recent Labs  Lab 01/17/18 1529 01/18/18 0613 01/21/18 0756  NA 139  --  138  K 2.7* 3.8 3.8  CL 96*  --  94*  CO2 34*  --  35*  GLUCOSE 111*  --  92  BUN 33*  --  31*  CREATININE 0.71  --  0.75  CALCIUM 8.4*  --  8.1*  MG 2.0  --   --     Liver Function Tests: No results for input(s): AST, ALT, ALKPHOS, BILITOT, PROT, ALBUMIN in the last 168 hours. No results for input(s): LIPASE, AMYLASE in the last 168 hours. No results for input(s): AMMONIA in the last 168 hours.  CBC: Recent Labs  Lab 01/17/18 1529  WBC 5.9  HGB 7.4*  HCT 24.8*  MCV 92.2  PLT 179    Cardiac Enzymes: No results for input(s): CKTOTAL, CKMB, CKMBINDEX, TROPONINI in the last 168 hours.  BNP (last 3 results) No results for input(s): BNP in the last 8760 hours.  ProBNP (last 3 results) No results for input(s):  PROBNP in the last 8760 hours.  Radiological Exams on Admission: No results found.  Assessment/Plan Active Problems:   Paraesophageal hernia   Esophageal obstruction   GERD (gastroesophageal reflux disease)   COPD (chronic obstructive pulmonary disease) (HCC)   History of lung cancer   1. Acute on chronic respiratory failure with hypoxia has been doing well with capping we will continue with capping as tolerated hopefully will have a decision from her son regarding whether he wants her decannulated at this time 2. Paraesophageal hernia treated we will continue to follow 3. GERD stable at this time 4. COPD severe disease will continue to follow 5. History of lung cancer follow-up as outpatient   I have personally seen and evaluated the patient, evaluated laboratory and imaging results, formulated the assessment and plan and placed orders. The Patient requires high complexity decision making for assessment and support.  Case was discussed on Rounds with the Respiratory Therapy Staff  Allyne Gee, MD [facility] Pulmonary Critical Care Medicine Sleep Medicine

## 2018-01-23 DIAGNOSIS — K222 Esophageal obstruction: Secondary | ICD-10-CM | POA: Diagnosis not present

## 2018-01-23 DIAGNOSIS — K219 Gastro-esophageal reflux disease without esophagitis: Secondary | ICD-10-CM | POA: Diagnosis not present

## 2018-01-23 DIAGNOSIS — J9621 Acute and chronic respiratory failure with hypoxia: Secondary | ICD-10-CM | POA: Diagnosis not present

## 2018-01-23 DIAGNOSIS — J449 Chronic obstructive pulmonary disease, unspecified: Secondary | ICD-10-CM | POA: Diagnosis not present

## 2018-01-23 LAB — CBC WITH DIFFERENTIAL/PLATELET
BASOS ABS: 0 10*3/uL (ref 0.0–0.1)
Basophils Relative: 0 %
EOS ABS: 0.1 10*3/uL (ref 0.0–0.7)
EOS PCT: 2 %
HCT: 24.1 % — ABNORMAL LOW (ref 36.0–46.0)
HEMOGLOBIN: 7.3 g/dL — AB (ref 12.0–15.0)
LYMPHS ABS: 1.8 10*3/uL (ref 0.7–4.0)
LYMPHS PCT: 28 %
MCH: 27.9 pg (ref 26.0–34.0)
MCHC: 30.3 g/dL (ref 30.0–36.0)
MCV: 92 fL (ref 78.0–100.0)
Monocytes Absolute: 1 10*3/uL (ref 0.1–1.0)
Monocytes Relative: 15 %
NEUTROS PCT: 55 %
Neutro Abs: 3.4 10*3/uL (ref 1.7–7.7)
PLATELETS: 110 10*3/uL — AB (ref 150–400)
RBC: 2.62 MIL/uL — AB (ref 3.87–5.11)
RDW: 16.5 % — ABNORMAL HIGH (ref 11.5–15.5)
WBC: 6.2 10*3/uL (ref 4.0–10.5)

## 2018-01-23 NOTE — Progress Notes (Signed)
Called to 5E RN to tell him that IR schedule will not allow [place] to do this pt procedure today. We will attempt to work pt in schedule tomorrow. RN verbalized understanding.

## 2018-01-23 NOTE — Progress Notes (Signed)
Pulmonary [place]   PULMONARY SERVICE  PROGRESS NOTE  Date of Service: 01/23/2018  Sherry Bennett  TDD:[id number]  DOB: 1941-06-16   DOA: 01/04/2018  Referring Physician: Merton Border, MD  HPI: Sherry Bennett is a 77 y.o. female seen for follow up of Acute on Chronic Respiratory Failure.  Patient's son is at the bedside we once again spoke about possibility of decannulation he states that he is ready for her to be decannulated and understands that there is a chance that she may fail  Medications: Reviewed on Rounds  Physical Exam:  Vitals: Temperature 97.4 pulse 70 respiratory 18 blood pressure 121/63 saturations are present  Ventilator Settings doing fine with capping  . General: Comfortable at this time . Eyes: Grossly normal lids, irises & conjunctiva . ENT: grossly tongue is normal . Neck: no obvious mass . Cardiovascular: S1-S2 normal no gallop . Respiratory: No rhonchi expansion equal . Abdomen: Soft nontender . Skin: no rash seen on limited exam . Musculoskeletal: not rigid . Psychiatric:unable to assess . Neurologic: no seizure no involuntary movements         Labs on Admission:  Basic Metabolic Panel: Recent Labs  Lab 01/17/18 1529 01/18/18 0613 01/21/18 0756  NA 139  --  138  K 2.7* 3.8 3.8  CL 96*  --  94*  CO2 34*  --  35*  GLUCOSE 111*  --  92  BUN 33*  --  31*  CREATININE 0.71  --  0.75  CALCIUM 8.4*  --  8.1*  MG 2.0  --   --     Liver Function Tests: No results for input(s): AST, ALT, ALKPHOS, BILITOT, PROT, ALBUMIN in the last 168 hours. No results for input(s): LIPASE, AMYLASE in the last 168 hours. No results for input(s): AMMONIA in the last 168 hours.  CBC: Recent Labs  Lab 01/17/18 1529 01/23/18 0744  WBC 5.9 6.2  NEUTROABS  --  3.4  HGB 7.4* 7.3*  HCT 24.8* 24.1*  MCV 92.2 92.0  PLT 179 110*    Cardiac Enzymes: No results for input(s): CKTOTAL, CKMB, CKMBINDEX, TROPONINI  in the last 168 hours.  BNP (last 3 results) No results for input(s): BNP in the last 8760 hours.  ProBNP (last 3 results) No results for input(s): PROBNP in the last 8760 hours.  Radiological Exams on Admission: No results found.  Assessment/Plan Active Problems:   Paraesophageal hernia   Esophageal obstruction   GERD (gastroesophageal reflux disease)   COPD (chronic obstructive pulmonary disease) (HCC)   History of lung cancer   1. Acute on chronic respiratory failure with hypoxia we will continue with supportive care and proceed to decannulation continue with oxygen therapy as needed 2. COPD severe disease will continue to follow along 3. Lung cancer history we will follow-up as an outpatient 4. Paraesophageal hernia repair we will continue with supportive care   I have personally seen and evaluated the patient, evaluated laboratory and imaging results, formulated the assessment and plan and placed orders. The Patient requires high complexity decision making for assessment and support.  Case was discussed on Rounds with the Respiratory Therapy Staff  Allyne Gee, MD [facility] Pulmonary Critical Care Medicine Sleep Medicine

## 2018-01-24 ENCOUNTER — Other Ambulatory Visit (HOSPITAL_COMMUNITY): Payer: Self-pay

## 2018-01-24 ENCOUNTER — Encounter (HOSPITAL_COMMUNITY): Payer: Self-pay | Admitting: Interventional Radiology

## 2018-01-24 HISTORY — PX: IR MECH REMOV OBSTRUC MAT ANY COLON TUBE W/FLUORO: IMG2335

## 2018-01-24 MED ORDER — IOPAMIDOL (ISOVUE-300) INJECTION 61%
INTRAVENOUS | Status: AC
  Administered 2018-01-24: 10 mL
  Filled 2018-01-24: qty 50

## 2018-01-24 MED ORDER — CHLORHEXIDINE GLUCONATE 4 % EX LIQD
CUTANEOUS | Status: AC
  Filled 2018-01-24: qty 15

## 2018-01-24 MED ORDER — LIDOCAINE VISCOUS 2 % MT SOLN
OROMUCOSAL | Status: AC
  Filled 2018-01-24: qty 15

## 2018-01-27 LAB — C DIFFICILE QUICK SCREEN W PCR REFLEX
C DIFFICILE (CDIFF) TOXIN: NEGATIVE
C Diff antigen: NEGATIVE
C Diff interpretation: NOT DETECTED

## 2018-01-27 LAB — OCCULT BLOOD X 1 CARD TO LAB, STOOL: Fecal Occult Bld: NEGATIVE

## 2018-01-28 LAB — BASIC METABOLIC PANEL
ANION GAP: 10 (ref 5–15)
BUN: 24 mg/dL — ABNORMAL HIGH (ref 6–20)
CHLORIDE: 88 mmol/L — AB (ref 101–111)
CO2: 40 mmol/L — AB (ref 22–32)
Calcium: 8.2 mg/dL — ABNORMAL LOW (ref 8.9–10.3)
Creatinine, Ser: 0.64 mg/dL (ref 0.44–1.00)
GFR calc non Af Amer: >60 mL/min (ref >60)
Glucose, Bld: 102 mg/dL — ABNORMAL HIGH (ref 65–99)
POTASSIUM: 2.9 mmol/L — AB (ref 3.5–5.1)
Sodium: 138 mmol/L (ref 135–145)

## 2018-01-28 LAB — POTASSIUM: Potassium: 4.5 mmol/L (ref 3.5–5.1)

## 2018-01-29 LAB — BASIC METABOLIC PANEL
Anion gap: 9 (ref 5–15)
Anion gap: 11 (ref 5–15)
BUN: 21 mg/dL — AB (ref 6–20)
BUN: 24 mg/dL — AB (ref 6–20)
CHLORIDE: 91 mmol/L — AB (ref 101–111)
CHLORIDE: 91 mmol/L — AB (ref 101–111)
CO2: 42 mmol/L — ABNORMAL HIGH (ref 22–32)
CO2: 39 mmol/L — AB (ref 22–32)
Calcium: 8.7 mg/dL — ABNORMAL LOW (ref 8.9–10.3)
Calcium: 8.5 mg/dL — ABNORMAL LOW (ref 8.9–10.3)
Creatinine, Ser: 0.78 mg/dL (ref 0.44–1.00)
Creatinine, Ser: 0.75 mg/dL (ref 0.44–1.00)
GFR calc Af Amer: >60 mL/min (ref >60)
GFR calc Af Amer: >60 mL/min (ref >60)
GFR calc non Af Amer: >60 mL/min (ref >60)
GFR calc non Af Amer: >60 mL/min (ref >60)
GLUCOSE: 84 mg/dL (ref 65–99)
GLUCOSE: 105 mg/dL — AB (ref 65–99)
POTASSIUM: 4 mmol/L (ref 3.5–5.1)
POTASSIUM: 4.1 mmol/L (ref 3.5–5.1)
Sodium: 142 mmol/L (ref 135–145)
Sodium: 141 mmol/L (ref 135–145)

## 2018-01-30 LAB — BASIC METABOLIC PANEL
ANION GAP: 8 (ref 5–15)
ANION GAP: 10 (ref 5–15)
BUN: 21 mg/dL — ABNORMAL HIGH (ref 6–20)
BUN: 21 mg/dL — ABNORMAL HIGH (ref 6–20)
CHLORIDE: 89 mmol/L — AB (ref 101–111)
CHLORIDE: 90 mmol/L — AB (ref 101–111)
CO2: 41 mmol/L — AB (ref 22–32)
CO2: 40 mmol/L — AB (ref 22–32)
Calcium: 8.6 mg/dL — ABNORMAL LOW (ref 8.9–10.3)
Calcium: 8.7 mg/dL — ABNORMAL LOW (ref 8.9–10.3)
Creatinine, Ser: 0.78 mg/dL (ref 0.44–1.00)
Creatinine, Ser: 0.8 mg/dL (ref 0.44–1.00)
GFR calc non Af Amer: >60 mL/min (ref >60)
GFR calc non Af Amer: >60 mL/min (ref >60)
Glucose, Bld: 116 mg/dL — ABNORMAL HIGH (ref 65–99)
Glucose, Bld: 105 mg/dL — ABNORMAL HIGH (ref 65–99)
Potassium: 3.7 mmol/L (ref 3.5–5.1)
Potassium: 3.4 mmol/L — ABNORMAL LOW (ref 3.5–5.1)
Sodium: 138 mmol/L (ref 135–145)
Sodium: 140 mmol/L (ref 135–145)

## 2018-01-31 LAB — URINALYSIS, ROUTINE W REFLEX MICROSCOPIC
Bilirubin Urine: NEGATIVE
Glucose, UA: NEGATIVE mg/dL
Hgb urine dipstick: NEGATIVE
Ketones, ur: NEGATIVE mg/dL
Nitrite: NEGATIVE
PROTEIN: 30 mg/dL — AB
Specific Gravity, Urine: 1.015 (ref 1.005–1.030)
pH: 8 (ref 5.0–8.0)

## 2018-01-31 LAB — BASIC METABOLIC PANEL
ANION GAP: 10 (ref 5–15)
Anion gap: 10 (ref 5–15)
BUN: 20 mg/dL (ref 6–20)
BUN: 22 mg/dL — ABNORMAL HIGH (ref 6–20)
CO2: 39 mmol/L — AB (ref 22–32)
CO2: 38 mmol/L — ABNORMAL HIGH (ref 22–32)
Calcium: 8.8 mg/dL — ABNORMAL LOW (ref 8.9–10.3)
Calcium: 8.5 mg/dL — ABNORMAL LOW (ref 8.9–10.3)
Chloride: 91 mmol/L — ABNORMAL LOW (ref 101–111)
Chloride: 88 mmol/L — ABNORMAL LOW (ref 101–111)
Creatinine, Ser: 0.81 mg/dL (ref 0.44–1.00)
Creatinine, Ser: 0.77 mg/dL (ref 0.44–1.00)
GFR calc Af Amer: >60 mL/min (ref >60)
GFR calc Af Amer: >60 mL/min (ref >60)
GFR calc non Af Amer: >60 mL/min (ref >60)
GFR calc non Af Amer: >60 mL/min (ref >60)
GLUCOSE: 113 mg/dL — AB (ref 65–99)
GLUCOSE: 109 mg/dL — AB (ref 65–99)
POTASSIUM: 3.5 mmol/L (ref 3.5–5.1)
POTASSIUM: 3.5 mmol/L (ref 3.5–5.1)
Sodium: 140 mmol/L (ref 135–145)
Sodium: 136 mmol/L (ref 135–145)

## 2018-02-01 ENCOUNTER — Other Ambulatory Visit (HOSPITAL_COMMUNITY): Payer: Self-pay

## 2018-02-01 LAB — BASIC METABOLIC PANEL
Anion gap: 13 (ref 5–15)
Anion gap: 8 (ref 5–15)
BUN: 25 mg/dL — AB (ref 6–20)
BUN: 22 mg/dL — AB (ref 6–20)
CHLORIDE: 92 mmol/L — AB (ref 101–111)
CHLORIDE: 90 mmol/L — AB (ref 101–111)
CO2: 35 mmol/L — ABNORMAL HIGH (ref 22–32)
CO2: 42 mmol/L — ABNORMAL HIGH (ref 22–32)
Calcium: 8.9 mg/dL (ref 8.9–10.3)
Calcium: 8.9 mg/dL (ref 8.9–10.3)
Creatinine, Ser: 0.77 mg/dL (ref 0.44–1.00)
Creatinine, Ser: 0.83 mg/dL (ref 0.44–1.00)
GFR calc Af Amer: >60 mL/min (ref >60)
GFR calc Af Amer: >60 mL/min (ref >60)
GFR calc non Af Amer: >60 mL/min (ref >60)
GFR calc non Af Amer: >60 mL/min (ref >60)
GLUCOSE: 101 mg/dL — AB (ref 65–99)
GLUCOSE: 97 mg/dL (ref 65–99)
POTASSIUM: 3.7 mmol/L (ref 3.5–5.1)
POTASSIUM: 3.4 mmol/L — AB (ref 3.5–5.1)
Sodium: 140 mmol/L (ref 135–145)
Sodium: 140 mmol/L (ref 135–145)

## 2018-02-01 LAB — CBC WITH DIFFERENTIAL/PLATELET
Basophils Absolute: 0 10*3/uL (ref 0.0–0.1)
Basophils Relative: 0 %
EOS PCT: 2 %
Eosinophils Absolute: 0.1 10*3/uL (ref 0.0–0.7)
HCT: 25.2 % — ABNORMAL LOW (ref 36.0–46.0)
HEMOGLOBIN: 7.7 g/dL — AB (ref 12.0–15.0)
LYMPHS ABS: 1.1 10*3/uL (ref 0.7–4.0)
LYMPHS PCT: 22 %
MCH: 27.7 pg (ref 26.0–34.0)
MCHC: 30.6 g/dL (ref 30.0–36.0)
MCV: 90.6 fL (ref 78.0–100.0)
Monocytes Absolute: 0.7 10*3/uL (ref 0.1–1.0)
Monocytes Relative: 14 %
NEUTROS PCT: 62 %
Neutro Abs: 3.2 10*3/uL (ref 1.7–7.7)
Platelets: 121 10*3/uL — ABNORMAL LOW (ref 150–400)
RBC: 2.78 MIL/uL — AB (ref 3.87–5.11)
RDW: 16.5 % — ABNORMAL HIGH (ref 11.5–15.5)
WBC: 5.1 10*3/uL (ref 4.0–10.5)

## 2018-02-02 ENCOUNTER — Other Ambulatory Visit (HOSPITAL_COMMUNITY): Payer: Self-pay

## 2018-02-02 DIAGNOSIS — J449 Chronic obstructive pulmonary disease, unspecified: Secondary | ICD-10-CM | POA: Diagnosis not present

## 2018-02-02 DIAGNOSIS — K449 Diaphragmatic hernia without obstruction or gangrene: Secondary | ICD-10-CM | POA: Diagnosis not present

## 2018-02-02 DIAGNOSIS — J9622 Acute and chronic respiratory failure with hypercapnia: Secondary | ICD-10-CM | POA: Diagnosis not present

## 2018-02-02 DIAGNOSIS — K219 Gastro-esophageal reflux disease without esophagitis: Secondary | ICD-10-CM | POA: Diagnosis not present

## 2018-02-02 DIAGNOSIS — J690 Pneumonitis due to inhalation of food and vomit: Secondary | ICD-10-CM | POA: Diagnosis not present

## 2018-02-02 DIAGNOSIS — Z85118 Personal history of other malignant neoplasm of bronchus and lung: Secondary | ICD-10-CM | POA: Diagnosis not present

## 2018-02-02 DIAGNOSIS — J9621 Acute and chronic respiratory failure with hypoxia: Secondary | ICD-10-CM | POA: Diagnosis not present

## 2018-02-02 DIAGNOSIS — K222 Esophageal obstruction: Secondary | ICD-10-CM | POA: Diagnosis not present

## 2018-02-02 LAB — BASIC METABOLIC PANEL
ANION GAP: 12 (ref 5–15)
BUN: 20 mg/dL (ref 6–20)
CO2: 36 mmol/L — AB (ref 22–32)
CREATININE: 0.78 mg/dL (ref 0.44–1.00)
Calcium: 8.8 mg/dL — ABNORMAL LOW (ref 8.9–10.3)
Chloride: 91 mmol/L — ABNORMAL LOW (ref 101–111)
GFR calc non Af Amer: >60 mL/min (ref >60)
GLUCOSE: 114 mg/dL — AB (ref 65–99)
Potassium: 3.5 mmol/L (ref 3.5–5.1)
Sodium: 139 mmol/L (ref 135–145)

## 2018-02-02 LAB — BLOOD GAS, ARTERIAL
ACID-BASE EXCESS: 15.7 mmol/L — AB (ref 0.0–2.0)
Acid-Base Excess: 15.5 mmol/L — ABNORMAL HIGH (ref 0.0–2.0)
BICARBONATE: 41.8 mmol/L — AB (ref 20.0–28.0)
Bicarbonate: 42.6 mmol/L — ABNORMAL HIGH (ref 20.0–28.0)
FIO2: 100
FIO2: 50
LHR: 18 {breaths}/min
O2 SAT: 99.2 %
O2 Saturation: 99.1 %
PATIENT TEMPERATURE: 98.6
PCO2 ART: 74.5 mmHg — AB (ref 32.0–48.0)
PEEP/CPAP: 5 cmH2O
PH ART: 7.291 — AB (ref 7.350–7.450)
PH ART: 7.367 (ref 7.350–7.450)
Patient temperature: 98.6
VT: 380 mL
pCO2 arterial: 91.3 mmHg (ref 32.0–48.0)
pO2, Arterial: 179 mmHg — ABNORMAL HIGH (ref 83.0–108.0)
pO2, Arterial: 148 mmHg — ABNORMAL HIGH (ref 83.0–108.0)

## 2018-02-02 LAB — CBC
HCT: 25.2 % — ABNORMAL LOW (ref 36.0–46.0)
HEMOGLOBIN: 7.4 g/dL — AB (ref 12.0–15.0)
MCH: 26.8 pg (ref 26.0–34.0)
MCHC: 29.4 g/dL — ABNORMAL LOW (ref 30.0–36.0)
MCV: 91.3 fL (ref 78.0–100.0)
Platelets: 104 10*3/uL — ABNORMAL LOW (ref 150–400)
RBC: 2.76 MIL/uL — AB (ref 3.87–5.11)
RDW: 16.2 % — ABNORMAL HIGH (ref 11.5–15.5)
WBC: 5.5 10*3/uL (ref 4.0–10.5)

## 2018-02-02 LAB — TROPONIN I: Troponin I: <0.03 ng/mL (ref <0.03)

## 2018-02-02 LAB — MAGNESIUM: Magnesium: 1.8 mg/dL (ref 1.7–2.4)

## 2018-02-02 NOTE — Progress Notes (Signed)
Pulmonary [place]   PULMONARY SERVICE  PROGRESS NOTE  Date of Service: 02/02/2018  Leonette Tischer Bickhart  NFA:[id number]  DOB: [birth year]   DOA: 01/04/2018  Referring Physician: Merton Border, MD  HPI: Sherry Bennett is a 77 y.o. female seen for follow up of Acute on Chronic Respiratory Failure.  Patient had an acute decompensation this morning with increased respiratory distress.  Rapid response was called and patient was found to be unresponsive.  Blood gas was drawn which showed a pH 7.29 with pCO2 greater than 90. Initially we were going to place her on BiPAP however she had increased work of breathing noted and she was looking fatigued so therefore there was up to 2 proceed with intubation.  Respiratory therapy staff was present in we went ahead in did the intubation at the bedside.  Medications: Reviewed on Rounds  Physical Exam:  Vitals:   Temperature 97.0 degrees pulse 68 respiratory rate 33 blood pressure 117/60 saturations were 93% on 100% non-rebreather mask  Ventilator Settings  Patient was on 100% non-rebreather mask prior to intubation and mechanical ventilation.  . General:   Increased work of breathing noted with paradoxical movements . Eyes: Grossly normal lids, irises & conjunctiva . ENT: grossly tongue is normal . Neck: no obvious mass . Cardiovascular:  S1-S2 normal slight tachycardia noted . Respiratory:  Increased work of breathing noted with retractions . Abdomen:  Distended obese and soft . Skin: no rash seen on limited exam . Musculoskeletal: not rigid . Psychiatric:unable to assess . Neurologic: no seizure no involuntary movements         Labs on Admission:  Basic Metabolic Panel: Recent Labs  Lab 01/31/18 0916 01/31/18 2134 02/01/18 0641 02/01/18 2038 02/02/18 0904  NA 140 136 140 140 139  K 3.5 3.5 3.7 3.4* 3.5  CL 91* 88* 92* 90* 91*  CO2 39* 38* 35* 42* 36*  GLUCOSE 113* 109* 101* 97 114*  BUN 20  22* 25* 22* 20  CREATININE 0.81 0.77 0.77 0.83 0.78  CALCIUM 8.8* 8.5* 8.9 8.9 8.8*  MG  --   --   --   --  1.8    Liver Function Tests: No results for input(s): AST, ALT, ALKPHOS, BILITOT, PROT, ALBUMIN in the last 168 hours. No results for input(s): LIPASE, AMYLASE in the last 168 hours. No results for input(s): AMMONIA in the last 168 hours.  CBC: Recent Labs  Lab 02/01/18 0641 02/02/18 0904  WBC 5.1 5.5  NEUTROABS 3.2  --   HGB 7.7* 7.4*  HCT 25.2* 25.2*  MCV 90.6 91.3  PLT 121* 104*    Cardiac Enzymes: Recent Labs  Lab 02/02/18 0904  TROPONINI <0.03    BNP (last 3 results) No results for input(s): BNP in the last 8760 hours.  ProBNP (last 3 results) No results for input(s): PROBNP in the last 8760 hours.  Radiological Exams on Admission: Dg Chest Port 1 View  Result Date: 02/02/2018 CLINICAL DATA:  ETT placement EXAM: PORTABLE CHEST 1 VIEW COMPARISON:  01/13/2018 FINDINGS: Interval removal of tracheostomy tube and placement of endotracheal tube. The tip is 1.5 cm above the carina. Diffuse bilateral airspace opacities are again noted, most confluent in the lower lobes with small bilateral effusions, cardiomegaly and vascular congestion. No real change. No pneumothorax. IMPRESSION: Bilateral airspace opacities and layering effusions. Cardiomegaly, vascular congestion. No real change. Electronically Signed   By: Rolm Baptise M.D.   On: 02/02/2018 10:21  Assessment/Plan Active Problems:   Paraesophageal hernia   Esophageal obstruction   GERD (gastroesophageal reflux disease)   COPD (chronic obstructive pulmonary disease) (HCC)   History of lung cancer   1.  acute on chronic Respiratory failure with hypoxia patient had been extubated doing very well.  Tracheal stoma was basically closed off patient had acute decompensation and since family would like to pursue doing everything it was decided to proceed with intubation placing the patient back on mechanical  ventilation.  Orders were given for the ventilator.  Will do followup ABGs.   2. Probable aspiration pneumonia patient did have apparent swallow study done which may have precipitated this.  Will need to follow the x-rays.  Current x-ray had shown bilateral airspace disease with wearing effusions. 3. GERD at baseline we will continue to follow along 4. COPD severe disease  5. Esophageal obstruction and parous off a gel hernia patient will be kept NPO and I do not think the patient should be fed again if she is able to come off the ventilator once again.  X-ray however as noted above is of concern to me   I have personally seen and evaluated the patient, evaluated laboratory and imaging results, formulated the assessment and plan and placed orders. The Patient requires high complexity decision making for assessment and support.  Case was discussed on Rounds with the Respiratory Therapy Staff patient is critically ill and in danger of cardiac arrest.  Time spent 35 min critical care  Allyne Gee, MD [facility] Pulmonary Critical Care Medicine Sleep Medicine

## 2018-02-03 DIAGNOSIS — K219 Gastro-esophageal reflux disease without esophagitis: Secondary | ICD-10-CM | POA: Diagnosis not present

## 2018-02-03 DIAGNOSIS — Z85118 Personal history of other malignant neoplasm of bronchus and lung: Secondary | ICD-10-CM | POA: Diagnosis not present

## 2018-02-03 DIAGNOSIS — J449 Chronic obstructive pulmonary disease, unspecified: Secondary | ICD-10-CM | POA: Diagnosis not present

## 2018-02-03 DIAGNOSIS — K449 Diaphragmatic hernia without obstruction or gangrene: Secondary | ICD-10-CM | POA: Diagnosis not present

## 2018-02-03 DIAGNOSIS — J90 Pleural effusion, not elsewhere classified: Secondary | ICD-10-CM

## 2018-02-03 DIAGNOSIS — J9601 Acute respiratory failure with hypoxia: Secondary | ICD-10-CM

## 2018-02-03 DIAGNOSIS — J9621 Acute and chronic respiratory failure with hypoxia: Secondary | ICD-10-CM | POA: Diagnosis not present

## 2018-02-03 DIAGNOSIS — J690 Pneumonitis due to inhalation of food and vomit: Secondary | ICD-10-CM | POA: Diagnosis not present

## 2018-02-03 DIAGNOSIS — K222 Esophageal obstruction: Secondary | ICD-10-CM | POA: Diagnosis not present

## 2018-02-03 LAB — URINE CULTURE

## 2018-02-03 LAB — MAGNESIUM: MAGNESIUM: 1.7 mg/dL (ref 1.7–2.4)

## 2018-02-03 LAB — POTASSIUM: POTASSIUM: 3.3 mmol/L — AB (ref 3.5–5.1)

## 2018-02-03 NOTE — Progress Notes (Signed)
Pulmonary [place]   PULMONARY SERVICE  PROGRESS NOTE  Date of Service: 02/03/2018  Dan Dissinger Berkey  JAS:[id number]  DOB: 1940/12/21   DOA: 01/04/2018  Referring Physician: Merton Border, MD  HPI: Sherry Bennett is a 77 y.o. female seen for follow up of Acute on Chronic Respiratory Failure.  Patient remains critically ill remains orally intubated on the ventilator right now.  Patient is on assist control mode.  Patient's son was present we had a discussion gave him the options to try to see if she is able to wean off the ventilator or if she may need a tracheostomy.  At this time we will try to wean her to extubation by weaning protocol however if she does not tolerate them she may need to have a tracheostomy done.  Medications: Reviewed on Rounds  Physical Exam:  Vitals: Temperature is 99.4 pulse 84 respiratory rate 19 blood pressure 111/63 saturations 98%  Ventilator Settings of ventilation was assist control FiO2 20% tidal volume 450 PEEP 5  . General: Comfortable at this time . Eyes: Grossly normal lids, irises & conjunctiva . ENT: grossly tongue is normal . Neck: no obvious mass . Cardiovascular: S1-S2 normal no gallop or rub . Respiratory: No rhonchi noted . Abdomen: Soft nondistended . Skin: no rash seen on limited exam . Musculoskeletal: not rigid . Psychiatric:unable to assess . Neurologic: no seizure no involuntary movements         Labs on Admission:  Basic Metabolic Panel: Recent Labs  Lab 01/31/18 0916 01/31/18 2134 02/01/18 0641 02/01/18 2038 02/02/18 0904  NA 140 136 140 140 139  K 3.5 3.5 3.7 3.4* 3.5  CL 91* 88* 92* 90* 91*  CO2 39* 38* 35* 42* 36*  GLUCOSE 113* 109* 101* 97 114*  BUN 20 22* 25* 22* 20  CREATININE 0.81 0.77 0.77 0.83 0.78  CALCIUM 8.8* 8.5* 8.9 8.9 8.8*  MG  --   --   --   --  1.8    Liver Function Tests: No results for input(s): AST, ALT, ALKPHOS, BILITOT, PROT, ALBUMIN in the  last 168 hours. No results for input(s): LIPASE, AMYLASE in the last 168 hours. No results for input(s): AMMONIA in the last 168 hours.  CBC: Recent Labs  Lab 02/01/18 0641 02/02/18 0904  WBC 5.1 5.5  NEUTROABS 3.2  --   HGB 7.7* 7.4*  HCT 25.2* 25.2*  MCV 90.6 91.3  PLT 121* 104*    Cardiac Enzymes: Recent Labs  Lab 02/02/18 0904  TROPONINI <0.03    BNP (last 3 results) No results for input(s): BNP in the last 8760 hours.  ProBNP (last 3 results) No results for input(s): PROBNP in the last 8760 hours.  Radiological Exams on Admission: Dg Chest Port 1 View  Result Date: 02/02/2018 CLINICAL DATA:  ETT placement EXAM: PORTABLE CHEST 1 VIEW COMPARISON:  01/13/2018 FINDINGS: Interval removal of tracheostomy tube and placement of endotracheal tube. The tip is 1.5 cm above the carina. Diffuse bilateral airspace opacities are again noted, most confluent in the lower lobes with small bilateral effusions, cardiomegaly and vascular congestion. No real change. No pneumothorax. IMPRESSION: Bilateral airspace opacities and layering effusions. Cardiomegaly, vascular congestion. No real change. Electronically Signed   By: Rolm Baptise M.D.   On: 02/02/2018 10:21    Assessment/Plan Active Problems:   Paraesophageal hernia   Esophageal obstruction   GERD (gastroesophageal reflux disease)   COPD (chronic obstructive pulmonary disease) (HCC)  History of lung cancer   1. Acute on chronic respiratory failure with hypoxia patient remains on full vent support at this time.  We will continue with pulmonary toilet secretion management check the our SBI try weaning on pressure support if able to tolerate 2. Paraesophageal hernia treated 3. Esophageal obstruction continue with supportive care 4. Aspiration pneumonia patient right now has bilateral infiltrates likely fluid overload and infiltrates secondary to aspiration is combination.  Will diurese as tolerated. 5. History of lung cancer  stable at this time 6. COPD advanced disease   I have personally seen and evaluated the patient, evaluated laboratory and imaging results, formulated the assessment and plan and placed orders. The Patient requires high complexity decision making for assessment and support.  Case was discussed on Rounds with the Respiratory Therapy Staff patient is critically ill in danger of cardiac arrest time spent critical care 35 minutes  Allyne Gee, MD [facility] Pulmonary Critical Care Medicine Sleep Medicine

## 2018-02-04 DIAGNOSIS — Z85118 Personal history of other malignant neoplasm of bronchus and lung: Secondary | ICD-10-CM | POA: Diagnosis not present

## 2018-02-04 DIAGNOSIS — J9601 Acute respiratory failure with hypoxia: Secondary | ICD-10-CM | POA: Diagnosis not present

## 2018-02-04 DIAGNOSIS — J449 Chronic obstructive pulmonary disease, unspecified: Secondary | ICD-10-CM | POA: Diagnosis not present

## 2018-02-04 DIAGNOSIS — K219 Gastro-esophageal reflux disease without esophagitis: Secondary | ICD-10-CM | POA: Diagnosis not present

## 2018-02-04 LAB — CBC
HEMATOCRIT: 21.8 % — AB (ref 36.0–46.0)
HEMOGLOBIN: 6.9 g/dL — AB (ref 12.0–15.0)
MCH: 27.2 pg (ref 26.0–34.0)
MCHC: 31.7 g/dL (ref 30.0–36.0)
MCV: 85.8 fL (ref 78.0–100.0)
Platelets: 97 10*3/uL — ABNORMAL LOW (ref 150–400)
RBC: 2.54 MIL/uL — ABNORMAL LOW (ref 3.87–5.11)
RDW: 16.5 % — ABNORMAL HIGH (ref 11.5–15.5)
WBC: 5.6 10*3/uL (ref 4.0–10.5)

## 2018-02-04 LAB — BASIC METABOLIC PANEL
ANION GAP: 9 (ref 5–15)
BUN: 26 mg/dL — ABNORMAL HIGH (ref 6–20)
CALCIUM: 8.6 mg/dL — AB (ref 8.9–10.3)
CO2: 37 mmol/L — ABNORMAL HIGH (ref 22–32)
Chloride: 90 mmol/L — ABNORMAL LOW (ref 101–111)
Creatinine, Ser: 1.07 mg/dL — ABNORMAL HIGH (ref 0.44–1.00)
GFR, EST AFRICAN AMERICAN: 56 mL/min — AB (ref >60)
GFR, EST NON AFRICAN AMERICAN: 48 mL/min — AB (ref >60)
GLUCOSE: 129 mg/dL — AB (ref 65–99)
POTASSIUM: 2.9 mmol/L — AB (ref 3.5–5.1)
SODIUM: 136 mmol/L (ref 135–145)

## 2018-02-04 LAB — PREPARE RBC (CROSSMATCH)

## 2018-02-04 LAB — ABO/RH: ABO/RH(D): O POS

## 2018-02-04 LAB — MAGNESIUM: MAGNESIUM: 1.6 mg/dL — AB (ref 1.7–2.4)

## 2018-02-04 NOTE — Progress Notes (Signed)
Pulmonary [place]   PULMONARY SERVICE  PROGRESS NOTE  Date of Service: 02/04/2018  Sherry Bennett  BJS:[id number]  DOB: [birth year]   DOA: 01/04/2018  Referring Physician: Merton Border, MD  HPI: Sherry Bennett is a 77 y.o. female seen for follow up of Acute on Chronic Respiratory Failure.  There is weaning she is awake comfortable responds appropriately she is on pressure support right now has been on 35% oxygen  Medications: Reviewed on Rounds  Physical Exam:  Vitals: Temperature 98.3 pulse 92 respiratory rate 16 blood pressure 129/65 saturations 97%  Ventilator Settings mode of ventilation pressure support FiO2 35% tidal volume 356 pressure support 12 PEEP 5  . General: Comfortable at this time . Eyes: Grossly normal lids, irises & conjunctiva . ENT: grossly tongue is normal . Neck: no obvious mass . Cardiovascular: S1-S2 normal no gallop or rub noted at this time . Respiratory: Scattered few rhonchi . Abdomen: Obese and soft . Skin: no rash seen on limited exam . Musculoskeletal: not rigid . Psychiatric:unable to assess . Neurologic: no seizure no involuntary movements         Labs on Admission:  Basic Metabolic Panel: Recent Labs  Lab 01/31/18 2134 02/01/18 0641 02/01/18 2038 02/02/18 0904 02/03/18 1729 02/04/18 0726  NA 136 140 140 139  --  136  K 3.5 3.7 3.4* 3.5 3.3* 2.9*  CL 88* 92* 90* 91*  --  90*  CO2 38* 35* 42* 36*  --  37*  GLUCOSE 109* 101* 97 114*  --  129*  BUN 22* 25* 22* 20  --  26*  CREATININE 0.77 0.77 0.83 0.78  --  1.07*  CALCIUM 8.5* 8.9 8.9 8.8*  --  8.6*  MG  --   --   --  1.8 1.7 1.6*    Liver Function Tests: No results for input(s): AST, ALT, ALKPHOS, BILITOT, PROT, ALBUMIN in the last 168 hours. No results for input(s): LIPASE, AMYLASE in the last 168 hours. No results for input(s): AMMONIA in the last 168 hours.  CBC: Recent Labs  Lab 02/01/18 0641 02/02/18 0904  02/04/18 0726  WBC 5.1 5.5 5.6  NEUTROABS 3.2  --   --   HGB 7.7* 7.4* 6.9*  HCT 25.2* 25.2* 21.8*  MCV 90.6 91.3 85.8  PLT 121* 104* 97*    Cardiac Enzymes: Recent Labs  Lab 02/02/18 0904  TROPONINI <0.03    BNP (last 3 results) No results for input(s): BNP in the last 8760 hours.  ProBNP (last 3 results) No results for input(s): PROBNP in the last 8760 hours.  Radiological Exams on Admission: Dg Chest Port 1 View  Result Date: 02/02/2018 CLINICAL DATA:  ETT placement EXAM: PORTABLE CHEST 1 VIEW COMPARISON:  01/13/2018 FINDINGS: Interval removal of tracheostomy tube and placement of endotracheal tube. The tip is 1.5 cm above the carina. Diffuse bilateral airspace opacities are again noted, most confluent in the lower lobes with small bilateral effusions, cardiomegaly and vascular congestion. No real change. No pneumothorax. IMPRESSION: Bilateral airspace opacities and layering effusions. Cardiomegaly, vascular congestion. No real change. Electronically Signed   By: Rolm Baptise M.D.   On: 02/02/2018 10:21    Assessment/Plan Active Problems:   Paraesophageal hernia   Esophageal obstruction   GERD (gastroesophageal reflux disease)   COPD (chronic obstructive pulmonary disease) (HCC)   History of lung cancer   1. Acute on chronic respiratory failure with hypoxia back on the weaning protocol goal is  to try to see if we can extubate her.  If she fails extubation then she will need a tracheostomy 2. COPD severe disease continue to monitor 3. History of lung cancer stable at this time 4. Esophageal obstruction paraesophageal hernia supportive care she is not to be fed if she does eventually come off of the ventilator   I have personally seen and evaluated the patient, evaluated laboratory and imaging results, formulated the assessment and plan and placed orders. The Patient requires high complexity decision making for assessment and support.  Case was discussed on Rounds with  the Respiratory Therapy Staff  Allyne Gee, MD [facility] Pulmonary Critical Care Medicine Sleep Medicine

## 2018-02-05 DIAGNOSIS — J449 Chronic obstructive pulmonary disease, unspecified: Secondary | ICD-10-CM | POA: Diagnosis not present

## 2018-02-05 DIAGNOSIS — J9601 Acute respiratory failure with hypoxia: Secondary | ICD-10-CM | POA: Diagnosis not present

## 2018-02-05 DIAGNOSIS — Z85118 Personal history of other malignant neoplasm of bronchus and lung: Secondary | ICD-10-CM | POA: Diagnosis not present

## 2018-02-05 DIAGNOSIS — K219 Gastro-esophageal reflux disease without esophagitis: Secondary | ICD-10-CM | POA: Diagnosis not present

## 2018-02-05 LAB — CBC
HCT: 26.2 % — ABNORMAL LOW (ref 36.0–46.0)
Hemoglobin: 8.3 g/dL — ABNORMAL LOW (ref 12.0–15.0)
MCH: 27.1 pg (ref 26.0–34.0)
MCHC: 31.7 g/dL (ref 30.0–36.0)
MCV: 85.6 fL (ref 78.0–100.0)
PLATELETS: 112 10*3/uL — AB (ref 150–400)
RBC: 3.06 MIL/uL — ABNORMAL LOW (ref 3.87–5.11)
RDW: 15.4 % (ref 11.5–15.5)
WBC: 5.9 10*3/uL (ref 4.0–10.5)

## 2018-02-05 LAB — BPAM RBC
Blood Product Expiration Date: 201905222359
ISSUE DATE / TIME: 201904271231
UNIT TYPE AND RH: 5100

## 2018-02-05 LAB — TYPE AND SCREEN
ABO/RH(D): O POS
ANTIBODY SCREEN: NEGATIVE
Unit division: 0

## 2018-02-05 LAB — POTASSIUM
Potassium: 3.4 mmol/L — ABNORMAL LOW (ref 3.5–5.1)
Potassium: 3.7 mmol/L (ref 3.5–5.1)

## 2018-02-05 NOTE — Progress Notes (Signed)
Pulmonary [place]   PULMONARY SERVICE  PROGRESS NOTE  Date of Service: 02/05/2018  Sherry Bennett  WEX:[id number]  DOB: [birth year]   DOA: 01/04/2018  Referring Physician: Merton Border, MD  HPI: Sherry Bennett is a 77 y.o. female seen for follow up of Acute on Chronic Respiratory Failure.  Patient is doing fairly well on pressure support currently the goal is for about 12 hours.  Patient's family was present in the room and they were updated.  Medications: Reviewed on Rounds  Physical Exam:  Vitals: Temperature 96.3 pulse 66 respiratory 19 blood pressure 135/70 saturations 100%  Ventilator Settings mode of ventilation pressure support FiO2 35% tidal volume 387 pressure support 12/5  . General: Comfortable at this time . Eyes: Grossly normal lids, irises & conjunctiva . ENT: grossly tongue is normal . Neck: no obvious mass . Cardiovascular: S1-S2 normal no gallop or rub . Respiratory: No rhonchi expansion equal . Abdomen: Soft nondistended . Skin: no rash seen on limited exam . Musculoskeletal: not rigid . Psychiatric:unable to assess . Neurologic: no seizure no involuntary movements         Labs on Admission:  Basic Metabolic Panel: Recent Labs  Lab 01/31/18 2134 02/01/18 0641 02/01/18 2038 02/02/18 0904 02/03/18 1729 02/04/18 0726 02/05/18 0639  NA 136 140 140 139  --  136  --   K 3.5 3.7 3.4* 3.5 3.3* 2.9* 3.4*  CL 88* 92* 90* 91*  --  90*  --   CO2 38* 35* 42* 36*  --  37*  --   GLUCOSE 109* 101* 97 114*  --  129*  --   BUN 22* 25* 22* 20  --  26*  --   CREATININE 0.77 0.77 0.83 0.78  --  1.07*  --   CALCIUM 8.5* 8.9 8.9 8.8*  --  8.6*  --   MG  --   --   --  1.8 1.7 1.6*  --     Liver Function Tests: No results for input(s): AST, ALT, ALKPHOS, BILITOT, PROT, ALBUMIN in the last 168 hours. No results for input(s): LIPASE, AMYLASE in the last 168 hours. No results for input(s): AMMONIA in the last 168  hours.  CBC: Recent Labs  Lab 02/01/18 0641 02/02/18 0904 02/04/18 0726 02/05/18 0639  WBC 5.1 5.5 5.6 5.9  NEUTROABS 3.2  --   --   --   HGB 7.7* 7.4* 6.9* 8.3*  HCT 25.2* 25.2* 21.8* 26.2*  MCV 90.6 91.3 85.8 85.6  PLT 121* 104* 97* 112*    Cardiac Enzymes: Recent Labs  Lab 02/02/18 0904  TROPONINI <0.03    BNP (last 3 results) No results for input(s): BNP in the last 8760 hours.  ProBNP (last 3 results) No results for input(s): PROBNP in the last 8760 hours.  Radiological Exams on Admission: Dg Chest Port 1 View  Result Date: 02/02/2018 CLINICAL DATA:  ETT placement EXAM: PORTABLE CHEST 1 VIEW COMPARISON:  01/13/2018 FINDINGS: Interval removal of tracheostomy tube and placement of endotracheal tube. The tip is 1.5 cm above the carina. Diffuse bilateral airspace opacities are again noted, most confluent in the lower lobes with small bilateral effusions, cardiomegaly and vascular congestion. No real change. No pneumothorax. IMPRESSION: Bilateral airspace opacities and layering effusions. Cardiomegaly, vascular congestion. No real change. Electronically Signed   By: Rolm Baptise M.D.   On: 02/02/2018 10:21    Assessment/Plan Active Problems:   Paraesophageal hernia   Esophageal obstruction  GERD (gastroesophageal reflux disease)   COPD (chronic obstructive pulmonary disease) (HCC)   History of lung cancer   1. Acute on chronic respiratory failure with hypoxia we will continue with pressure support likely working towards decannulation 2. Paraesophageal hernia at baseline 3. Esophageal obstruction reduced 4. GERD currently not active 5. COPD severe disease at baseline 6. History of lung cancer we will continue to follow-up as outpatient   I have personally seen and evaluated the patient, evaluated laboratory and imaging results, formulated the assessment and plan and placed orders. The Patient requires high complexity decision making for assessment and support.   Case was discussed on Rounds with the Respiratory Therapy Staff  Allyne Gee, MD [facility] Pulmonary Critical Care Medicine Sleep Medicine

## 2018-02-06 ENCOUNTER — Other Ambulatory Visit (HOSPITAL_COMMUNITY): Payer: Self-pay

## 2018-02-06 DIAGNOSIS — J9621 Acute and chronic respiratory failure with hypoxia: Secondary | ICD-10-CM | POA: Diagnosis not present

## 2018-02-06 DIAGNOSIS — K449 Diaphragmatic hernia without obstruction or gangrene: Secondary | ICD-10-CM | POA: Diagnosis not present

## 2018-02-06 DIAGNOSIS — Z85118 Personal history of other malignant neoplasm of bronchus and lung: Secondary | ICD-10-CM | POA: Diagnosis not present

## 2018-02-06 DIAGNOSIS — J449 Chronic obstructive pulmonary disease, unspecified: Secondary | ICD-10-CM | POA: Diagnosis not present

## 2018-02-06 NOTE — Progress Notes (Signed)
Pulmonary [place]   PULMONARY SERVICE  PROGRESS NOTE  Date of Service: 02/06/2018  Pascale Maves Kwiatek  EVO:[id number]  DOB: 1941/06/09   DOA: 01/04/2018  Referring Physician: Merton Border, MD  HPI: Sherry Bennett is a 77 y.o. female seen for follow up of Acute on Chronic Respiratory Failure.  She is comfortable at this time awake and alert.  She is been tolerating her pressure support trials very well.  Right now is on 35% oxygen with a 12-hour goal  Medications: Reviewed on Rounds  Physical Exam:  Vitals: Temperature 98.5 pulse 76 respiratory rate 35 blood pressure 115/72 saturations 100%  Ventilator Settings mode of ventilation pressure support FiO2 35% tidal volume 360 pressure 12 PEEP 5  . General: Comfortable at this time . Eyes: Grossly normal lids, irises & conjunctiva . ENT: grossly tongue is normal . Neck: no obvious mass . Cardiovascular: S1-S2 normal no gallop or rub . Respiratory: Clear . Abdomen: Soft obese nondistended . Skin: no rash seen on limited exam . Musculoskeletal: not rigid . Psychiatric:unable to assess . Neurologic: no seizure no involuntary movements         Labs on Admission:  Basic Metabolic Panel: Recent Labs  Lab 01/31/18 2134 02/01/18 0641 02/01/18 2038 02/02/18 0904 02/03/18 1729 02/04/18 0726 02/05/18 0639 02/05/18 1702  NA 136 140 140 139  --  136  --   --   K 3.5 3.7 3.4* 3.5 3.3* 2.9* 3.4* 3.7  CL 88* 92* 90* 91*  --  90*  --   --   CO2 38* 35* 42* 36*  --  37*  --   --   GLUCOSE 109* 101* 97 114*  --  129*  --   --   BUN 22* 25* 22* 20  --  26*  --   --   CREATININE 0.77 0.77 0.83 0.78  --  1.07*  --   --   CALCIUM 8.5* 8.9 8.9 8.8*  --  8.6*  --   --   MG  --   --   --  1.8 1.7 1.6*  --   --     Liver Function Tests: No results for input(s): AST, ALT, ALKPHOS, BILITOT, PROT, ALBUMIN in the last 168 hours. No results for input(s): LIPASE, AMYLASE in the last 168 hours. No  results for input(s): AMMONIA in the last 168 hours.  CBC: Recent Labs  Lab 02/01/18 0641 02/02/18 0904 02/04/18 0726 02/05/18 0639  WBC 5.1 5.5 5.6 5.9  NEUTROABS 3.2  --   --   --   HGB 7.7* 7.4* 6.9* 8.3*  HCT 25.2* 25.2* 21.8* 26.2*  MCV 90.6 91.3 85.8 85.6  PLT 121* 104* 97* 112*    Cardiac Enzymes: Recent Labs  Lab 02/02/18 0904  TROPONINI <0.03    BNP (last 3 results) No results for input(s): BNP in the last 8760 hours.  ProBNP (last 3 results) No results for input(s): PROBNP in the last 8760 hours.  Radiological Exams on Admission: Dg Chest Port 1 View  Result Date: 02/06/2018 CLINICAL DATA:  Endotracheal tube placement. EXAM: PORTABLE CHEST 1 VIEW COMPARISON:  Radiograph of February 02, 2018. FINDINGS: Stable cardiomegaly. Endotracheal tube is in stable position. No pneumothorax is noted. Stable bibasilar subsegmental atelectasis or infiltrates are noted. Minimal pleural effusions may be present. Bony thorax is unremarkable. Possible developing right upper lobe opacity is noted. Bony thorax is unremarkable. IMPRESSION: Endotracheal tube in grossly good position. Stable  bibasilar subsegmental atelectasis or infiltrates are noted with minimal pleural effusions. Increased right upper lobe airspace opacity is noted concerning for developing pneumonia. Continued radiographic follow-up is recommended. Electronically Signed   By: Marijo Conception, M.D.   On: 02/06/2018 07:59   Dg Chest Port 1 View  Result Date: 02/02/2018 CLINICAL DATA:  ETT placement EXAM: PORTABLE CHEST 1 VIEW COMPARISON:  01/13/2018 FINDINGS: Interval removal of tracheostomy tube and placement of endotracheal tube. The tip is 1.5 cm above the carina. Diffuse bilateral airspace opacities are again noted, most confluent in the lower lobes with small bilateral effusions, cardiomegaly and vascular congestion. No real change. No pneumothorax. IMPRESSION: Bilateral airspace opacities and layering effusions.  Cardiomegaly, vascular congestion. No real change. Electronically Signed   By: Rolm Baptise M.D.   On: 02/02/2018 10:21    Assessment/Plan Active Problems:   Paraesophageal hernia   Esophageal obstruction   GERD (gastroesophageal reflux disease)   COPD (chronic obstructive pulmonary disease) (HCC)   History of lung cancer   1. Acute on chronic respiratory failure with hypoxia now back on the weaning protocol we will continue to advance it as tolerated.  Hopefully will be able to extubate her 2. COPD severe disease we will continue to follow along 3. History of lung cancer stable at this time 4. Esophageal obstruction treated 5. Paraesophageal hernia treated   I have personally seen and evaluated the patient, evaluated laboratory and imaging results, formulated the assessment and plan and placed orders. The Patient requires high complexity decision making for assessment and support.  Case was discussed on Rounds with the Respiratory Therapy Staff  Allyne Gee, MD [facility] Pulmonary Critical Care Medicine Sleep Medicine

## 2018-02-06 NOTE — Progress Notes (Signed)
Called to 5E to inform nurse that due to emergency in IR we will not be able to do procedure today. We will try to get to pt tomorrow as IR schedule allows.

## 2018-02-07 ENCOUNTER — Encounter (HOSPITAL_COMMUNITY): Payer: Self-pay | Admitting: Interventional Radiology

## 2018-02-07 ENCOUNTER — Other Ambulatory Visit (HOSPITAL_COMMUNITY): Payer: Self-pay

## 2018-02-07 DIAGNOSIS — J9621 Acute and chronic respiratory failure with hypoxia: Secondary | ICD-10-CM | POA: Diagnosis not present

## 2018-02-07 DIAGNOSIS — J449 Chronic obstructive pulmonary disease, unspecified: Secondary | ICD-10-CM | POA: Diagnosis not present

## 2018-02-07 DIAGNOSIS — K222 Esophageal obstruction: Secondary | ICD-10-CM | POA: Diagnosis not present

## 2018-02-07 DIAGNOSIS — K219 Gastro-esophageal reflux disease without esophagitis: Secondary | ICD-10-CM | POA: Diagnosis not present

## 2018-02-07 HISTORY — PX: IR GJ TUBE CHANGE: IMG1440

## 2018-02-07 MED ORDER — IOPAMIDOL (ISOVUE-300) INJECTION 61%
INTRAVENOUS | Status: AC
  Administered 2018-02-07: 20 mL
  Filled 2018-02-07: qty 50

## 2018-02-07 MED ORDER — LIDOCAINE VISCOUS 2 % MT SOLN
OROMUCOSAL | Status: AC
  Filled 2018-02-07: qty 15

## 2018-02-07 MED ORDER — CHLORHEXIDINE GLUCONATE 4 % EX LIQD
CUTANEOUS | Status: AC
  Filled 2018-02-07: qty 15

## 2018-02-07 MED ORDER — LIDOCAINE VISCOUS 2 % MT SOLN
OROMUCOSAL | Status: DC | PRN
  Administered 2018-02-07: 15 mL via OROMUCOSAL

## 2018-02-07 MED ORDER — CHLORHEXIDINE GLUCONATE 4 % EX LIQD
CUTANEOUS | Status: DC | PRN
  Administered 2018-02-07: 1 via TOPICAL

## 2018-02-07 NOTE — Progress Notes (Signed)
Pulmonary [place]   PULMONARY SERVICE  PROGRESS NOTE  Date of Service: 02/07/2018  Sherry Bennett  DQQ:[id number]  DOB: December 30, 1940   DOA: 01/04/2018  Referring Physician: Merton Border, MD  HPI: Sherry Bennett is a 77 y.o. female seen for follow up of Acute on Chronic Respiratory Failure.  She was back on pressure support mode did about 3 hours today with a try to advance her for room.  Medications: Reviewed on Rounds  Physical Exam:  Vitals: Temperature 97.2 pulse 60 respiratory 23 blood pressure 140/68 saturations 100%  Ventilator Settings mode of ventilation pressure support FiO2 28% tidal volume 340 pressure support 12 PEEP 5  . General: Comfortable at this time . Eyes: Grossly normal lids, irises & conjunctiva . ENT: grossly tongue is normal . Neck: no obvious mass . Cardiovascular: S1-S2 normal no gallop or rub . Respiratory: No rhonchi noted . Abdomen: Soft nontender . Skin: no rash seen on limited exam . Musculoskeletal: not rigid . Psychiatric:unable to assess . Neurologic: no seizure no involuntary movements         Labs on Admission:  Basic Metabolic Panel: Recent Labs  Lab 01/31/18 2134 02/01/18 0641 02/01/18 2038 02/02/18 0904 02/03/18 1729 02/04/18 0726 02/05/18 0639 02/05/18 1702  NA 136 140 140 139  --  136  --   --   K 3.5 3.7 3.4* 3.5 3.3* 2.9* 3.4* 3.7  CL 88* 92* 90* 91*  --  90*  --   --   CO2 38* 35* 42* 36*  --  37*  --   --   GLUCOSE 109* 101* 97 114*  --  129*  --   --   BUN 22* 25* 22* 20  --  26*  --   --   CREATININE 0.77 0.77 0.83 0.78  --  1.07*  --   --   CALCIUM 8.5* 8.9 8.9 8.8*  --  8.6*  --   --   MG  --   --   --  1.8 1.7 1.6*  --   --     Liver Function Tests: No results for input(s): AST, ALT, ALKPHOS, BILITOT, PROT, ALBUMIN in the last 168 hours. No results for input(s): LIPASE, AMYLASE in the last 168 hours. No results for input(s): AMMONIA in the last 168  hours.  CBC: Recent Labs  Lab 02/01/18 0641 02/02/18 0904 02/04/18 0726 02/05/18 0639  WBC 5.1 5.5 5.6 5.9  NEUTROABS 3.2  --   --   --   HGB 7.7* 7.4* 6.9* 8.3*  HCT 25.2* 25.2* 21.8* 26.2*  MCV 90.6 91.3 85.8 85.6  PLT 121* 104* 97* 112*    Cardiac Enzymes: Recent Labs  Lab 02/02/18 0904  TROPONINI <0.03    BNP (last 3 results) No results for input(s): BNP in the last 8760 hours.  ProBNP (last 3 results) No results for input(s): PROBNP in the last 8760 hours.  Radiological Exams on Admission: Ir Gj Tube Change  Result Date: 02/07/2018 INDICATION: hole and external portion of gastrojejunostomy catheter EXAM: EXCHANGE OF GASTROJEJUNOSTOMY CATHETER UNDER FLUOROSCOPY MEDICATIONS: None indicated ANESTHESIA/SEDATION: None required CONTRAST:  20 mL Isovue-300-administered into the bowel lumen. FLUOROSCOPY TIME:  1 minutes 18 seconds; 6 mGy COMPLICATIONS: None immediate. PROCEDURE: Maximal Sterile Barrier Technique was utilized including caps, mask, sterile gowns, sterile gloves, sterile drape, hand hygiene and skin antiseptic. A timeout was performed prior to the initiation of the procedure. Contrast injection of the jejunal port  confirmed leaking from the external portion of the catheter. On fluoroscopy, a tortuous course of the jejunal limb was evident, tip in the proximal jejunum. The retention balloon was deflated and the catheter was partially withdrawn. The jejunal limb was cut and an angled Glidewire advanced through the catheter into the proximal jejunum. The catheter remainder was removed and a new 82 French dual-lumen gastrojejunal feeding tube was advanced, positioned with the tip in the proximal jejunum, gastric port in the stomach. The retention balloon was inflated with 10 mL sterile saline. Injection of both lumens under fluoroscopy demonstrates appropriate positioning and patency. The patient tolerated the procedure well. IMPRESSION: 1. Technically successful exchange of  18 French dual-lumen gastrojejunal feeding tube under fluoroscopy. Okay for routine use. Electronically Signed   By: Lucrezia Europe M.D.   On: 02/07/2018 11:59   Dg Chest Port 1 View  Result Date: 02/06/2018 CLINICAL DATA:  Endotracheal tube placement. EXAM: PORTABLE CHEST 1 VIEW COMPARISON:  Radiograph of February 02, 2018. FINDINGS: Stable cardiomegaly. Endotracheal tube is in stable position. No pneumothorax is noted. Stable bibasilar subsegmental atelectasis or infiltrates are noted. Minimal pleural effusions may be present. Bony thorax is unremarkable. Possible developing right upper lobe opacity is noted. Bony thorax is unremarkable. IMPRESSION: Endotracheal tube in grossly good position. Stable bibasilar subsegmental atelectasis or infiltrates are noted with minimal pleural effusions. Increased right upper lobe airspace opacity is noted concerning for developing pneumonia. Continued radiographic follow-up is recommended. Electronically Signed   By: Marijo Conception, M.D.   On: 02/06/2018 07:59    Assessment/Plan Active Problems:   Paraesophageal hernia   Esophageal obstruction   GERD (gastroesophageal reflux disease)   COPD (chronic obstructive pulmonary disease) (HCC)   History of lung cancer   1. Acute on chronic respiratory failure with hypoxia we will continue with pressure support mode we will titrate oxygen as tolerated I am hopefully we can try to extubate her over she is not been consistent in terms of being able to tolerate the pressure support so we may consider tracheostomy if she fails 2. Paraesophageal hernia treated 3. Esophageal obstruction treated 4. GERD at baseline 5. COPD severe disease we will continue to monitor 6. History of lung cancer follow-up as outpatient   I have personally seen and evaluated the patient, evaluated laboratory and imaging results, formulated the assessment and plan and placed orders. The Patient requires high complexity decision making for  assessment and support.  Case was discussed on Rounds with the Respiratory Therapy Staff  Allyne Gee, MD [facility] Pulmonary Critical Care Medicine Sleep Medicine

## 2018-02-07 NOTE — Procedures (Signed)
  Procedure: GJ exchange under fluoro 21F EBL:   minimal Complications:  none immediate  See full dictation in [facility].  Dillard Cannon MD Main # [phone] Pager  [phone]

## 2018-02-08 DIAGNOSIS — J9621 Acute and chronic respiratory failure with hypoxia: Secondary | ICD-10-CM | POA: Diagnosis not present

## 2018-02-08 DIAGNOSIS — J449 Chronic obstructive pulmonary disease, unspecified: Secondary | ICD-10-CM | POA: Diagnosis not present

## 2018-02-08 DIAGNOSIS — K222 Esophageal obstruction: Secondary | ICD-10-CM | POA: Diagnosis not present

## 2018-02-08 DIAGNOSIS — K219 Gastro-esophageal reflux disease without esophagitis: Secondary | ICD-10-CM | POA: Diagnosis not present

## 2018-02-08 LAB — BASIC METABOLIC PANEL
Anion gap: 8 (ref 5–15)
BUN: 24 mg/dL — AB (ref 6–20)
CO2: 32 mmol/L (ref 22–32)
Calcium: 8.8 mg/dL — ABNORMAL LOW (ref 8.9–10.3)
Chloride: 96 mmol/L — ABNORMAL LOW (ref 101–111)
Creatinine, Ser: 0.69 mg/dL (ref 0.44–1.00)
GFR calc Af Amer: >60 mL/min (ref >60)
GLUCOSE: 101 mg/dL — AB (ref 65–99)
POTASSIUM: 2.8 mmol/L — AB (ref 3.5–5.1)
Sodium: 136 mmol/L (ref 135–145)

## 2018-02-08 LAB — CBC
HCT: 25.4 % — ABNORMAL LOW (ref 36.0–46.0)
Hemoglobin: 8.2 g/dL — ABNORMAL LOW (ref 12.0–15.0)
MCH: 27.5 pg (ref 26.0–34.0)
MCHC: 32.3 g/dL (ref 30.0–36.0)
MCV: 85.2 fL (ref 78.0–100.0)
PLATELETS: 113 10*3/uL — AB (ref 150–400)
RBC: 2.98 MIL/uL — AB (ref 3.87–5.11)
RDW: 15.6 % — AB (ref 11.5–15.5)
WBC: 6.6 10*3/uL (ref 4.0–10.5)

## 2018-02-08 NOTE — Progress Notes (Signed)
Pulmonary [place]   PULMONARY SERVICE  PROGRESS NOTE  Date of Service: 02/08/2018  Sherry Bennett  GHW:[id number]  DOB: Mar 28, 1941   DOA: 01/04/2018  Referring Physician: Merton Border, MD  HPI: Sherry Bennett is a 77 y.o. female seen for follow up of Acute on Chronic Respiratory Failure.  She is doing very well on pressure support yesterday was able to do 16 hours on pressure support today she is back on the pressure support so hopefully will be able to extubate.  Medications: Reviewed on Rounds  Physical Exam:  Vitals: Temperature 97.0 pulse 72 respiratory 21 blood pressure 131/66 saturations 100%  Ventilator Settings mode of ventilation pressure support FiO2 28% tidal volume 381 pressure support 12 PEEP 5  . General: Comfortable at this time . Eyes: Grossly normal lids, irises & conjunctiva . ENT: grossly tongue is normal . Neck: no obvious mass . Cardiovascular: S1-S2 normal no gallop or rub . Respiratory: No rhonchi expansion is equal . Abdomen: Soft nontender . Skin: no rash seen on limited exam . Musculoskeletal: not rigid . Psychiatric:unable to assess . Neurologic: no seizure no involuntary movements         Labs on Admission:  Basic Metabolic Panel: Recent Labs  Lab 02/01/18 2038 02/02/18 0904 02/03/18 1729 02/04/18 0726 02/05/18 0639 02/05/18 1702 02/08/18 0533  NA 140 139  --  136  --   --  136  K 3.4* 3.5 3.3* 2.9* 3.4* 3.7 2.8*  CL 90* 91*  --  90*  --   --  96*  CO2 42* 36*  --  37*  --   --  32  GLUCOSE 97 114*  --  129*  --   --  101*  BUN 22* 20  --  26*  --   --  24*  CREATININE 0.83 0.78  --  1.07*  --   --  0.69  CALCIUM 8.9 8.8*  --  8.6*  --   --  8.8*  MG  --  1.8 1.7 1.6*  --   --   --     Liver Function Tests: No results for input(s): AST, ALT, ALKPHOS, BILITOT, PROT, ALBUMIN in the last 168 hours. No results for input(s): LIPASE, AMYLASE in the last 168 hours. No results for  input(s): AMMONIA in the last 168 hours.  CBC: Recent Labs  Lab 02/02/18 0904 02/04/18 0726 02/05/18 0639 02/08/18 0533  WBC 5.5 5.6 5.9 6.6  HGB 7.4* 6.9* 8.3* 8.2*  HCT 25.2* 21.8* 26.2* 25.4*  MCV 91.3 85.8 85.6 85.2  PLT 104* 97* 112* 113*    Cardiac Enzymes: Recent Labs  Lab 02/02/18 0904  TROPONINI <0.03    BNP (last 3 results) No results for input(s): BNP in the last 8760 hours.  ProBNP (last 3 results) No results for input(s): PROBNP in the last 8760 hours.  Radiological Exams on Admission: Ir Gj Tube Change  Result Date: 02/07/2018 INDICATION: hole and external portion of gastrojejunostomy catheter EXAM: EXCHANGE OF GASTROJEJUNOSTOMY CATHETER UNDER FLUOROSCOPY MEDICATIONS: None indicated ANESTHESIA/SEDATION: None required CONTRAST:  20 mL Isovue-300-administered into the bowel lumen. FLUOROSCOPY TIME:  1 minutes 18 seconds; 6 mGy COMPLICATIONS: None immediate. PROCEDURE: Maximal Sterile Barrier Technique was utilized including caps, mask, sterile gowns, sterile gloves, sterile drape, hand hygiene and skin antiseptic. A timeout was performed prior to the initiation of the procedure. Contrast injection of the jejunal port confirmed leaking from the external portion of the catheter. On  fluoroscopy, a tortuous course of the jejunal limb was evident, tip in the proximal jejunum. The retention balloon was deflated and the catheter was partially withdrawn. The jejunal limb was cut and an angled Glidewire advanced through the catheter into the proximal jejunum. The catheter remainder was removed and a new 55 French dual-lumen gastrojejunal feeding tube was advanced, positioned with the tip in the proximal jejunum, gastric port in the stomach. The retention balloon was inflated with 10 mL sterile saline. Injection of both lumens under fluoroscopy demonstrates appropriate positioning and patency. The patient tolerated the procedure well. IMPRESSION: 1. Technically successful exchange  of 18 French dual-lumen gastrojejunal feeding tube under fluoroscopy. Okay for routine use. Electronically Signed   By: Lucrezia Europe M.D.   On: 02/07/2018 11:59   Dg Chest Port 1 View  Result Date: 02/06/2018 CLINICAL DATA:  Endotracheal tube placement. EXAM: PORTABLE CHEST 1 VIEW COMPARISON:  Radiograph of February 02, 2018. FINDINGS: Stable cardiomegaly. Endotracheal tube is in stable position. No pneumothorax is noted. Stable bibasilar subsegmental atelectasis or infiltrates are noted. Minimal pleural effusions may be present. Bony thorax is unremarkable. Possible developing right upper lobe opacity is noted. Bony thorax is unremarkable. IMPRESSION: Endotracheal tube in grossly good position. Stable bibasilar subsegmental atelectasis or infiltrates are noted with minimal pleural effusions. Increased right upper lobe airspace opacity is noted concerning for developing pneumonia. Continued radiographic follow-up is recommended. Electronically Signed   By: Marijo Conception, M.D.   On: 02/06/2018 07:59    Assessment/Plan Active Problems:   Paraesophageal hernia   Esophageal obstruction   GERD (gastroesophageal reflux disease)   COPD (chronic obstructive pulmonary disease) (HCC)   History of lung cancer   1. Acute on chronic respiratory failure with hypoxia we will proceed towards extubation the son was present in the room and we explained the process to him.  He agrees with proceeding to extubate and if she fails we will try her on BiPAP otherwise if she fails she may need to have a tracheostomy done. 2. Paraesophageal hernia and esophageal obstruction treated we will continue with supportive care 3. GERD stable 4. History of lung cancer follow-up as outpatient 5. COPD severe disease   I have personally seen and evaluated the patient, evaluated laboratory and imaging results, formulated the assessment and plan and placed orders. The Patient requires high complexity decision making for assessment and  support.  Case was discussed on Rounds with the Respiratory Therapy Staff time 35 minutes coordination of care  Allyne Gee, MD [facility] Pulmonary Critical Care Medicine Sleep Medicine

## 2018-02-09 DIAGNOSIS — K222 Esophageal obstruction: Secondary | ICD-10-CM | POA: Diagnosis not present

## 2018-02-09 DIAGNOSIS — J449 Chronic obstructive pulmonary disease, unspecified: Secondary | ICD-10-CM | POA: Diagnosis not present

## 2018-02-09 DIAGNOSIS — K219 Gastro-esophageal reflux disease without esophagitis: Secondary | ICD-10-CM | POA: Diagnosis not present

## 2018-02-09 DIAGNOSIS — J9621 Acute and chronic respiratory failure with hypoxia: Secondary | ICD-10-CM | POA: Diagnosis not present

## 2018-02-09 LAB — POTASSIUM
POTASSIUM: 3.2 mmol/L — AB (ref 3.5–5.1)
POTASSIUM: 3.7 mmol/L (ref 3.5–5.1)

## 2018-02-09 NOTE — Progress Notes (Signed)
Pulmonary [place]   PULMONARY SERVICE  PROGRESS NOTE  Date of Service: 02/09/2018  Sherry Bennett  PPI:[id number]  DOB: 1941-02-04   DOA: 01/04/2018  Referring Physician: Merton Border, MD  HPI: Sherry Bennett is a 77 y.o. female seen for follow up of Acute on Chronic Respiratory Failure.  Doing well at this time.  Patient has been on nasal cannula she is extubated  Medications: Reviewed on Rounds  Physical Exam:  Vitals: Temperature 97.0 pulse 87 respiratory 26 blood pressure 115/56 saturation 95%  Ventilator Settings off of the ventilator  . General: Comfortable at this time . Eyes: Grossly normal lids, irises & conjunctiva . ENT: grossly tongue is normal . Neck: no obvious mass . Cardiovascular: S1-S2 normal no gallop . Respiratory: No rhonchi . Abdomen: Soft obese . Skin: no rash seen on limited exam . Musculoskeletal: not rigid . Psychiatric:unable to assess . Neurologic: no seizure no involuntary movements         Labs on Admission:  Basic Metabolic Panel: Recent Labs  Lab 02/03/18 1729 02/04/18 0726 02/05/18 0639 02/05/18 1702 02/08/18 0533 02/09/18 0741  NA  --  136  --   --  136  --   K 3.3* 2.9* 3.4* 3.7 2.8* 3.2*  CL  --  90*  --   --  96*  --   CO2  --  37*  --   --  32  --   GLUCOSE  --  129*  --   --  101*  --   BUN  --  26*  --   --  24*  --   CREATININE  --  1.07*  --   --  0.69  --   CALCIUM  --  8.6*  --   --  8.8*  --   MG 1.7 1.6*  --   --   --   --     Liver Function Tests: No results for input(s): AST, ALT, ALKPHOS, BILITOT, PROT, ALBUMIN in the last 168 hours. No results for input(s): LIPASE, AMYLASE in the last 168 hours. No results for input(s): AMMONIA in the last 168 hours.  CBC: Recent Labs  Lab 02/04/18 0726 02/05/18 0639 02/08/18 0533  WBC 5.6 5.9 6.6  HGB 6.9* 8.3* 8.2*  HCT 21.8* 26.2* 25.4*  MCV 85.8 85.6 85.2  PLT 97* 112* 113*    Cardiac Enzymes: No results  for input(s): CKTOTAL, CKMB, CKMBINDEX, TROPONINI in the last 168 hours.  BNP (last 3 results) No results for input(s): BNP in the last 8760 hours.  ProBNP (last 3 results) No results for input(s): PROBNP in the last 8760 hours.  Radiological Exams on Admission: Ir Gj Tube Change  Result Date: 02/07/2018 INDICATION: hole and external portion of gastrojejunostomy catheter EXAM: EXCHANGE OF GASTROJEJUNOSTOMY CATHETER UNDER FLUOROSCOPY MEDICATIONS: None indicated ANESTHESIA/SEDATION: None required CONTRAST:  20 mL Isovue-300-administered into the bowel lumen. FLUOROSCOPY TIME:  1 minutes 18 seconds; 6 mGy COMPLICATIONS: None immediate. PROCEDURE: Maximal Sterile Barrier Technique was utilized including caps, mask, sterile gowns, sterile gloves, sterile drape, hand hygiene and skin antiseptic. A timeout was performed prior to the initiation of the procedure. Contrast injection of the jejunal port confirmed leaking from the external portion of the catheter. On fluoroscopy, a tortuous course of the jejunal limb was evident, tip in the proximal jejunum. The retention balloon was deflated and the catheter was partially withdrawn. The jejunal limb was cut and an angled Glidewire advanced  through the catheter into the proximal jejunum. The catheter remainder was removed and a new 38 French dual-lumen gastrojejunal feeding tube was advanced, positioned with the tip in the proximal jejunum, gastric port in the stomach. The retention balloon was inflated with 10 mL sterile saline. Injection of both lumens under fluoroscopy demonstrates appropriate positioning and patency. The patient tolerated the procedure well. IMPRESSION: 1. Technically successful exchange of 18 French dual-lumen gastrojejunal feeding tube under fluoroscopy. Okay for routine use. Electronically Signed   By: Lucrezia Europe M.D.   On: 02/07/2018 11:59   Dg Chest Port 1 View  Result Date: 02/06/2018 CLINICAL DATA:  Endotracheal tube placement. EXAM:  PORTABLE CHEST 1 VIEW COMPARISON:  Radiograph of February 02, 2018. FINDINGS: Stable cardiomegaly. Endotracheal tube is in stable position. No pneumothorax is noted. Stable bibasilar subsegmental atelectasis or infiltrates are noted. Minimal pleural effusions may be present. Bony thorax is unremarkable. Possible developing right upper lobe opacity is noted. Bony thorax is unremarkable. IMPRESSION: Endotracheal tube in grossly good position. Stable bibasilar subsegmental atelectasis or infiltrates are noted with minimal pleural effusions. Increased right upper lobe airspace opacity is noted concerning for developing pneumonia. Continued radiographic follow-up is recommended. Electronically Signed   By: Marijo Conception, M.D.   On: 02/06/2018 07:59    Assessment/Plan Active Problems:   Paraesophageal hernia   Esophageal obstruction   GERD (gastroesophageal reflux disease)   COPD (chronic obstructive pulmonary disease) (HCC)   History of lung cancer   1. Acute on chronic respiratory failure with hypoxia she is extubated once again doing better we will continue with supportive care hopefully we will not have to reintubate her.  She does have some sundowning issues and the son is very aware of that. 2. Esophageal obstruction treated we will continue to follow 3. GERD stable 4. COPD 5. History of lung cancer will follow-up as outpatient   I have personally seen and evaluated the patient, evaluated laboratory and imaging results, formulated the assessment and plan and placed orders. The Patient requires high complexity decision making for assessment and support.  Case was discussed on Rounds with the Respiratory Therapy Staff  Allyne Gee, MD [facility] Pulmonary Critical Care Medicine Sleep Medicine

## 2018-02-10 LAB — POTASSIUM: Potassium: 3.3 mmol/L — ABNORMAL LOW (ref 3.5–5.1)

## 2018-02-14 LAB — BASIC METABOLIC PANEL
Anion gap: 7 (ref 5–15)
BUN: 20 mg/dL (ref 6–20)
CHLORIDE: 97 mmol/L — AB (ref 101–111)
CO2: 34 mmol/L — ABNORMAL HIGH (ref 22–32)
Calcium: 8.1 mg/dL — ABNORMAL LOW (ref 8.9–10.3)
Creatinine, Ser: 0.59 mg/dL (ref 0.44–1.00)
GFR calc non Af Amer: >60 mL/min (ref >60)
Glucose, Bld: 98 mg/dL (ref 65–99)
POTASSIUM: 3.4 mmol/L — AB (ref 3.5–5.1)
SODIUM: 138 mmol/L (ref 135–145)

## 2018-02-14 LAB — CBC
HCT: 22.9 % — ABNORMAL LOW (ref 36.0–46.0)
HEMOGLOBIN: 7.2 g/dL — AB (ref 12.0–15.0)
MCH: 27.2 pg (ref 26.0–34.0)
MCHC: 31.4 g/dL (ref 30.0–36.0)
MCV: 86.4 fL (ref 78.0–100.0)
Platelets: 194 10*3/uL (ref 150–400)
RBC: 2.65 MIL/uL — ABNORMAL LOW (ref 3.87–5.11)
RDW: 16 % — ABNORMAL HIGH (ref 11.5–15.5)
WBC: 9.8 10*3/uL (ref 4.0–10.5)

## 2019-09-27 IMAGING — DX DG ABD PORTABLE 1V
1 series · 1 of 1 positions shown · non-contrast
Comparison: None.

CLINICAL DATA: Gastrojejunal tube adjusted/replaced several times
over last two months, and has just transferred to [facility]
here.

EXAM:
PORTABLE ABDOMEN - 1 VIEW

[abdomen kub]
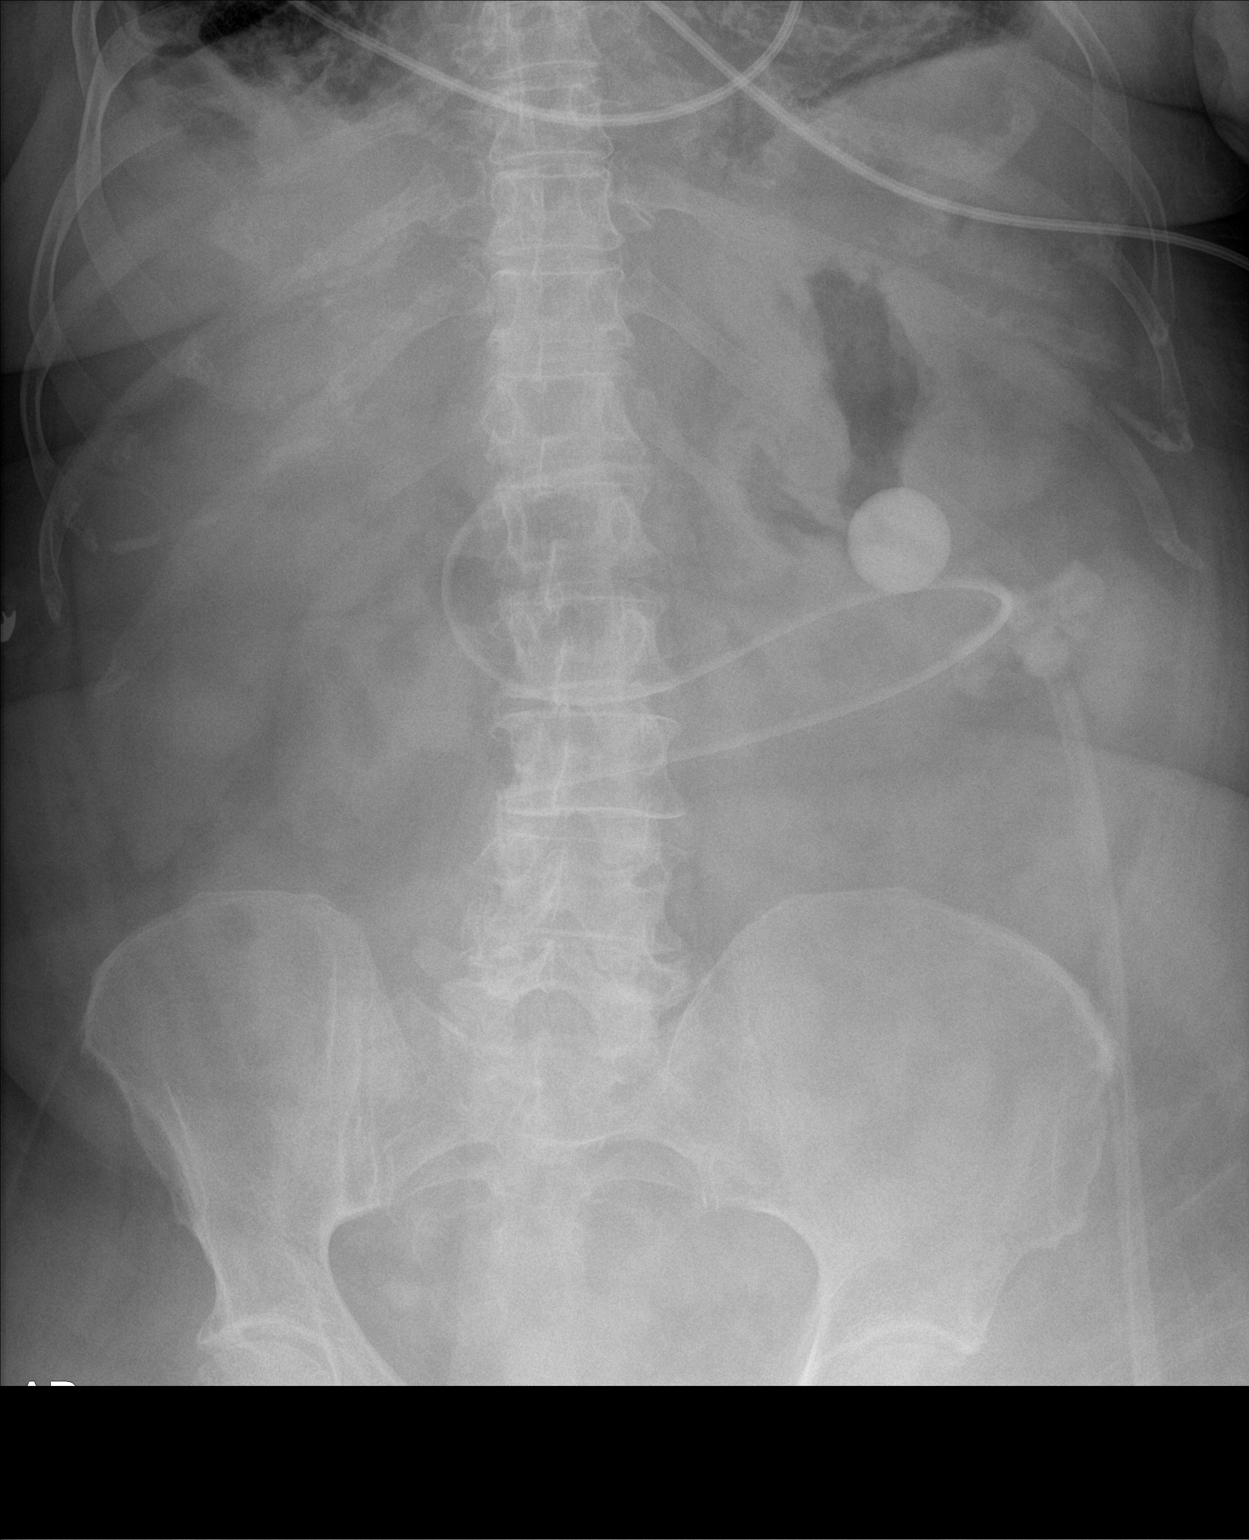

[1 of 1 positions shown; findings below may reference images not displayed]

FINDINGS: Gastrojejunostomy with the distal tip projecting over the proximal
jejunum. There is no bowel dilatation to suggest obstruction. There
is no evidence of pneumoperitoneum, portal venous gas or
pneumatosis.

There are no pathologic calcifications along the expected course of
the ureters.

The osseous structures are unremarkable.
IMPRESSION: Gastrojejunostomy with the distal tip projecting over the proximal
jejunum.

## 2019-10-03 IMAGING — DX DG CHEST 1V PORT
1 series · 1 of 1 positions shown · non-contrast
Comparison: 12/28/2017

CLINICAL DATA: Acute respiratory failure

EXAM:
PORTABLE CHEST 1 VIEW

[chest ap]
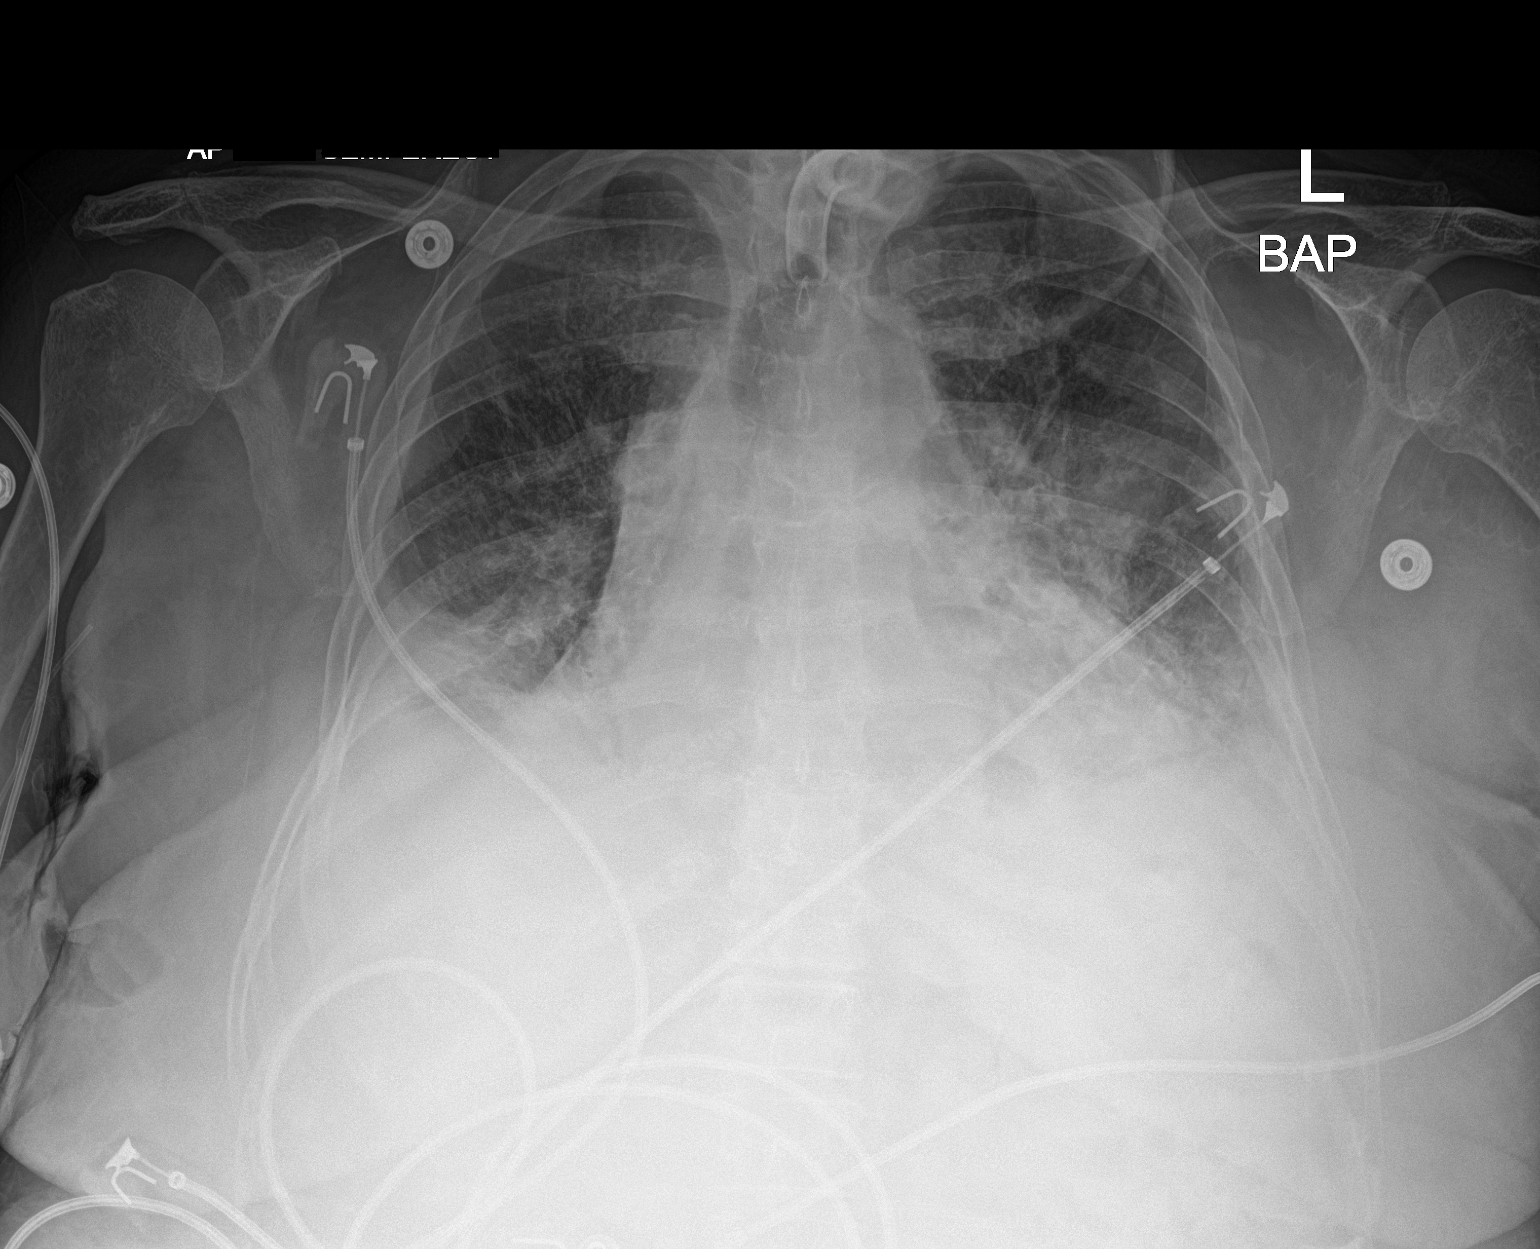

[1 of 1 positions shown; findings below may reference images not displayed]

FINDINGS: Stable multifocal patchy opacities in the left upper lobe and
bilateral lower lobes, suggesting multifocal infection.

Stable increased perihilar markings, suggesting mild interstitial
edema. Associated small bilateral pleural effusions, unchanged.

Cardiomegaly.

Tracheostomy in satisfactory position.
IMPRESSION: Multifocal patchy opacities in the left upper lobe and bilateral
lower lobes, suggesting multifocal pneumonia, grossly unchanged.

Cardiomegaly with possible mild interstitial edema and small
bilateral pleural effusions, unchanged.

## 2019-10-05 IMAGING — DX DG CHEST 1V PORT
1 series · 1 of 1 positions shown · non-contrast
Comparison: 01/11/2018.  01/05/2018

CLINICAL DATA: Follow-up pleural effusion.

EXAM:
PORTABLE CHEST 1 VIEW

[chest ap]
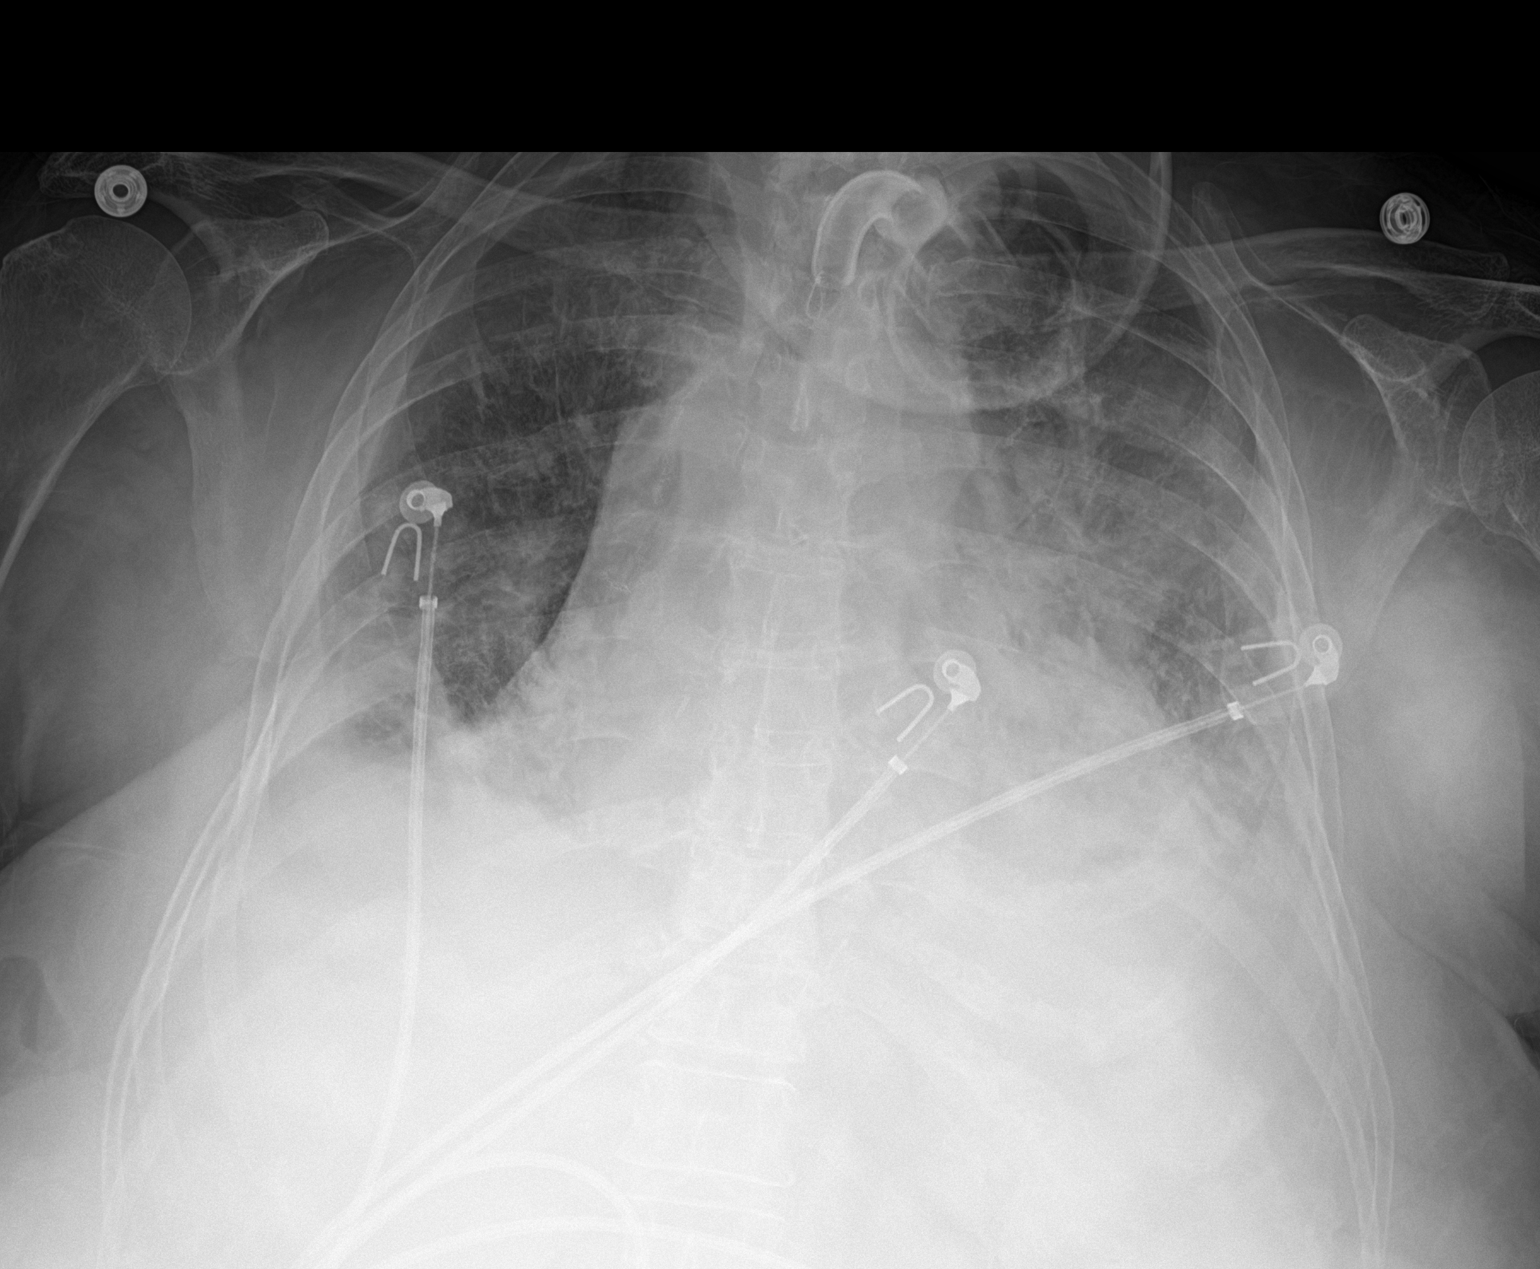

[1 of 1 positions shown; findings below may reference images not displayed]

FINDINGS: Tracheostomy remains in place. Persistent bilateral density
consistent with effusions and atelectasis. Certainly, coexistent
pneumonia not excluded. Radiographic worsening over the last 2
exams.
IMPRESSION: Persistent worsening of bilateral density. Findings consistent with
effusions and atelectasis, possibly with coexistent pneumonia.

## 2019-10-07 IMAGING — DX DG ABD PORTABLE 1V
1 series · 1 of 1 positions shown · non-contrast
Comparison: None.

CLINICAL DATA: Jejunostomy tube evaluation.

EXAM:
PORTABLE ABDOMEN - 1 VIEW

[abdomen kub]
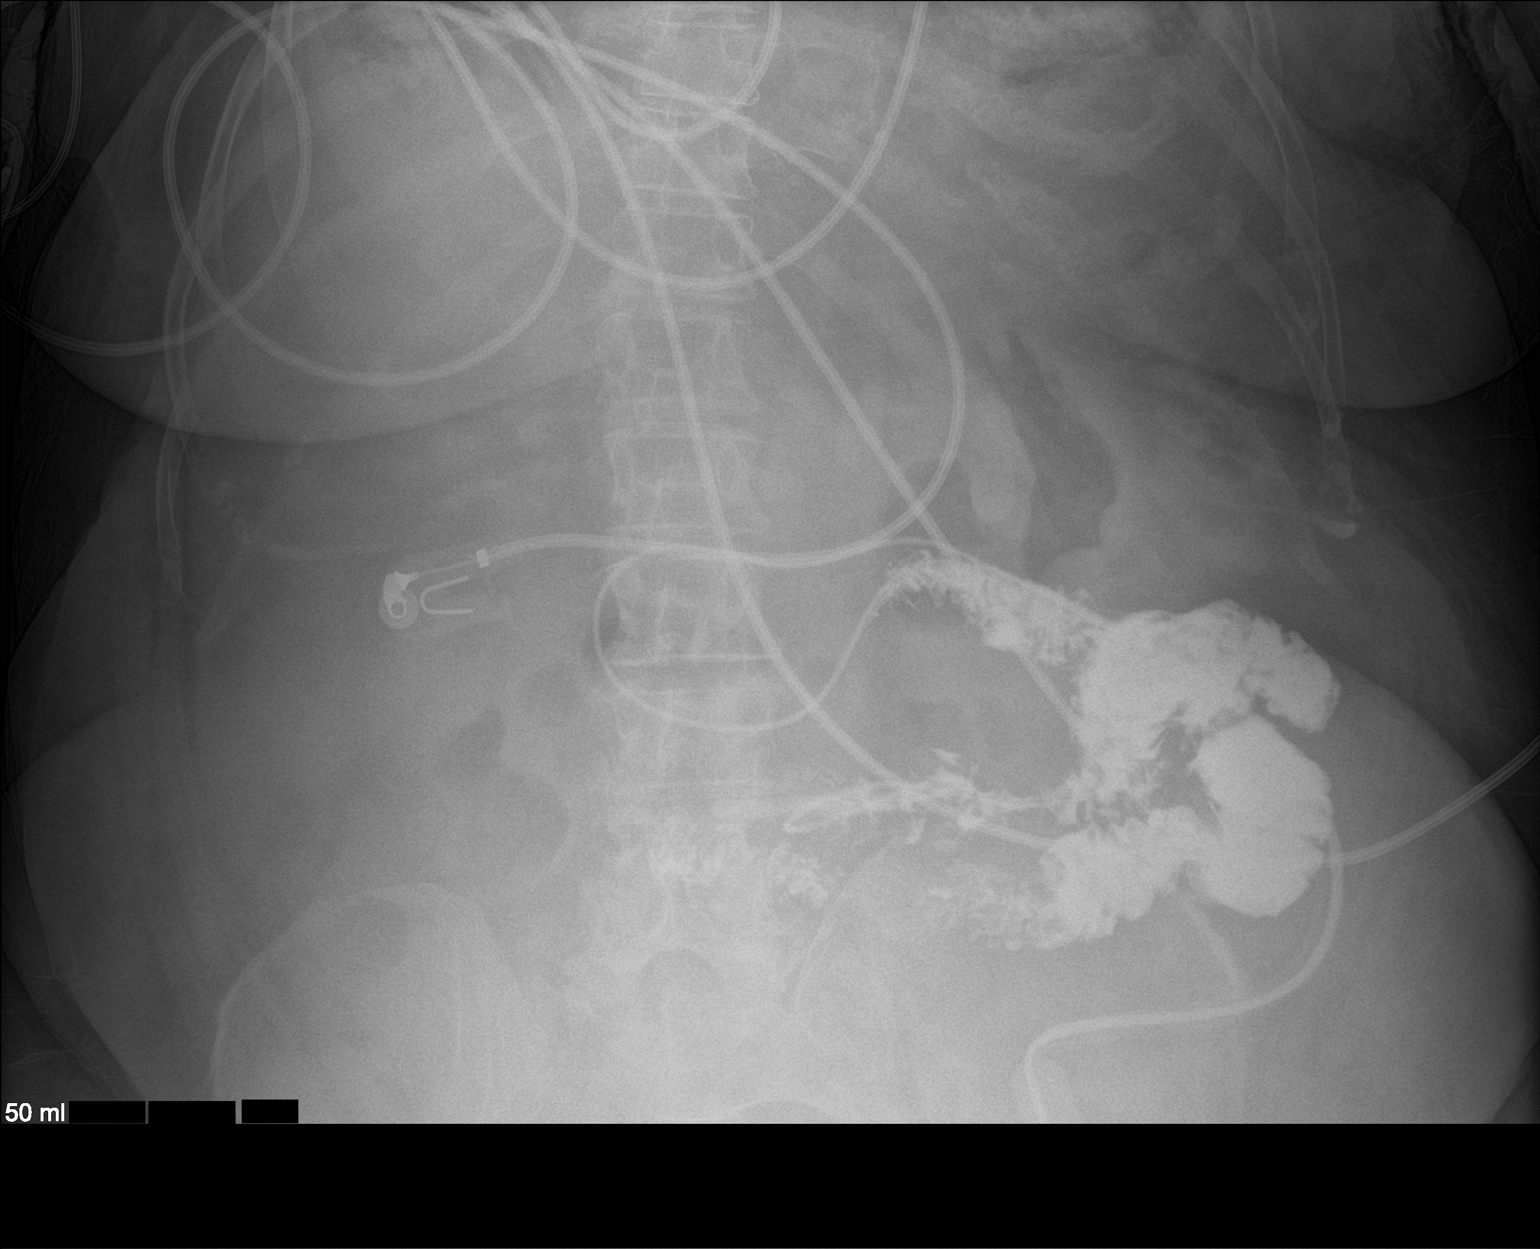

[1 of 1 positions shown; findings below may reference images not displayed]

FINDINGS: Contrast has been injected through the jejunostomy tube with
contrast noted in the lumen of the jejunum. No evidence of leak. No
other abnormalities.
IMPRESSION: Contrast injected through the jejunostomy tube enters the jejunum
with no evidence of leak.

## 2019-10-10 IMAGING — CT CT HEAD W/O CM
4 series · 16 of 47 positions shown, 18 images · non-contrast
Comparison: None.

CLINICAL DATA: Acute altered mental status.

EXAM:
CT HEAD WITHOUT CONTRAST
TECHNIQUE: Contiguous axial images were obtained from the base of the skull
through the vertex without intravenous contrast.

[Series 3: head without · axial · non-contrast · 0.41mm/px · z∈[-75,+30]mm · 7 of 29 slices shown, 9 images]
[im 4/29  brain]
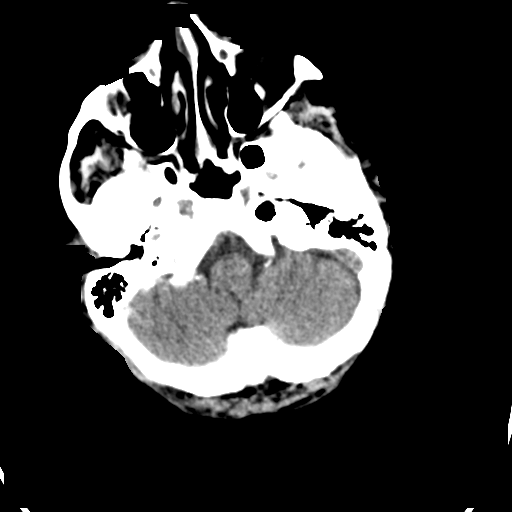
[im 4/29  bone]
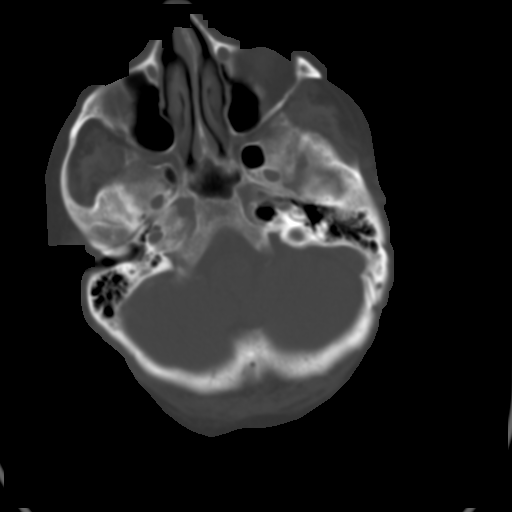
[im 8/29  brain]
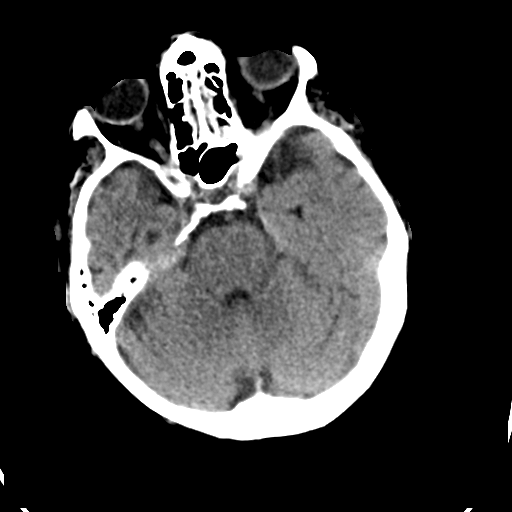
[im 11/29  brain]
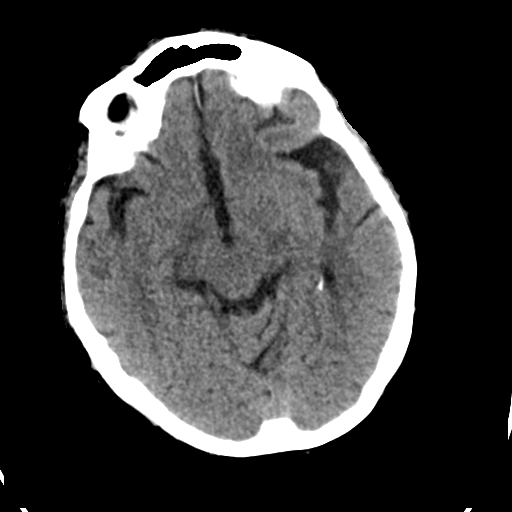
[im 15/29  brain]
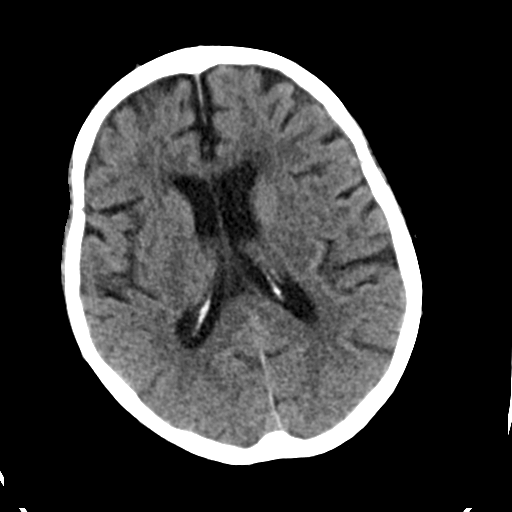
[im 18/29  brain]
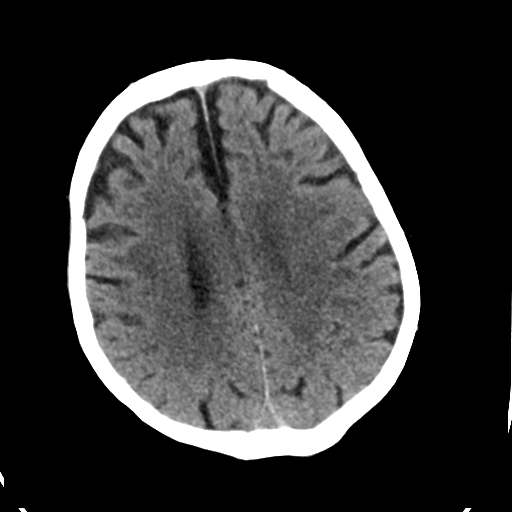
[im 18/29  bone]
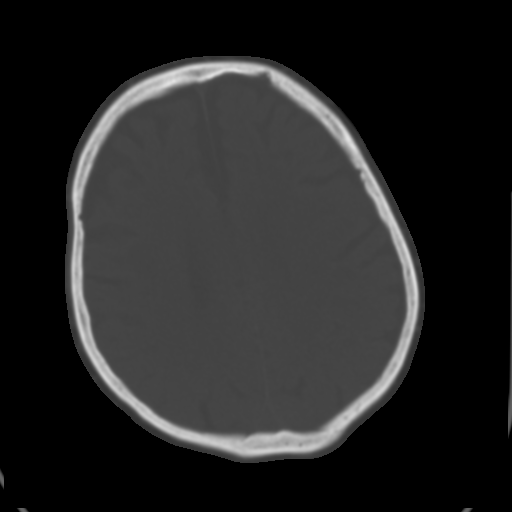
[im 22/29  brain]
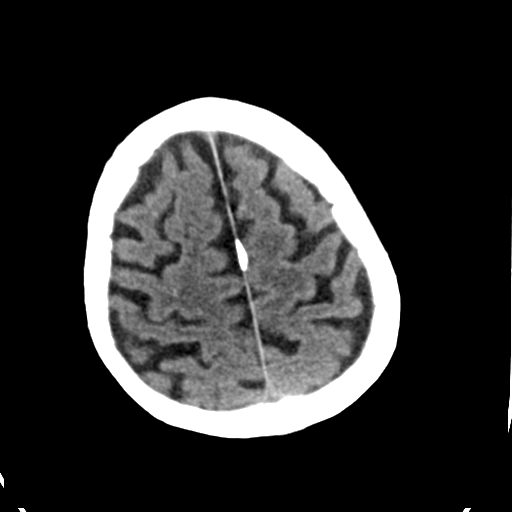
[im 25/29  brain]
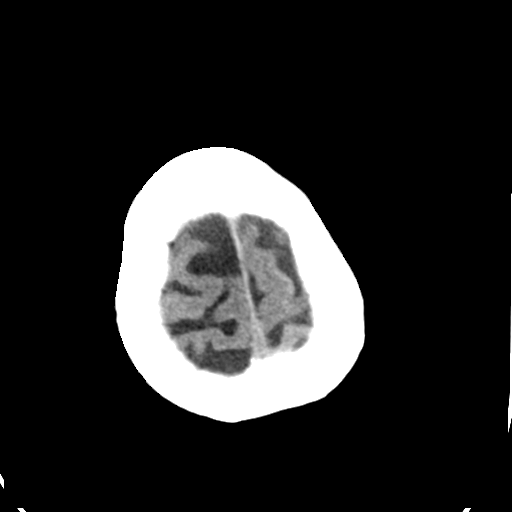

[Series 4: head bone · axial · 0.41mm/px · z∈[-76,-48]mm · 3 of 73 slices shown]
[im 8/73  bone]
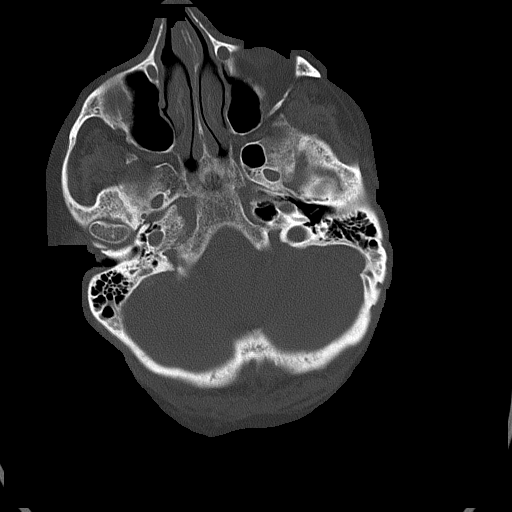
[im 15/73  bone]
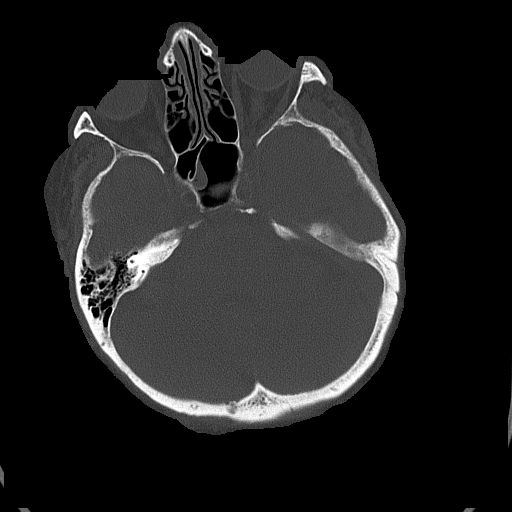
[im 22/73  bone]
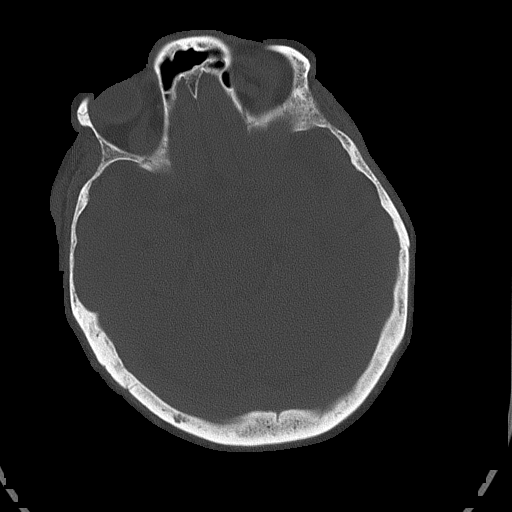

[Series 5: head without cor · coronal · non-contrast · 0.28mm/px · 3 of 67 slices shown]
[im 23/67  brain]
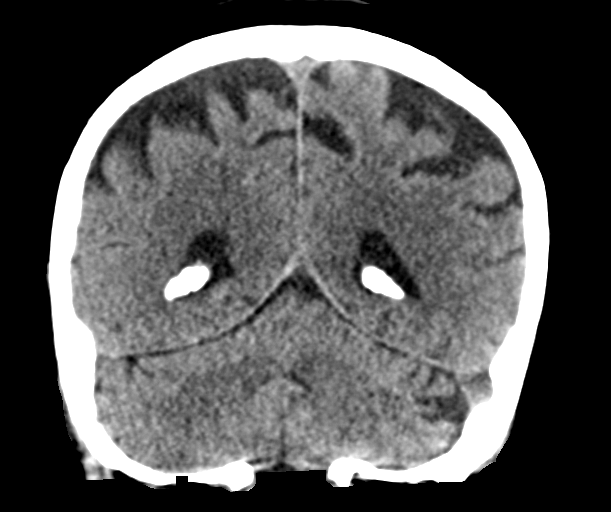
[im 30/67  brain]
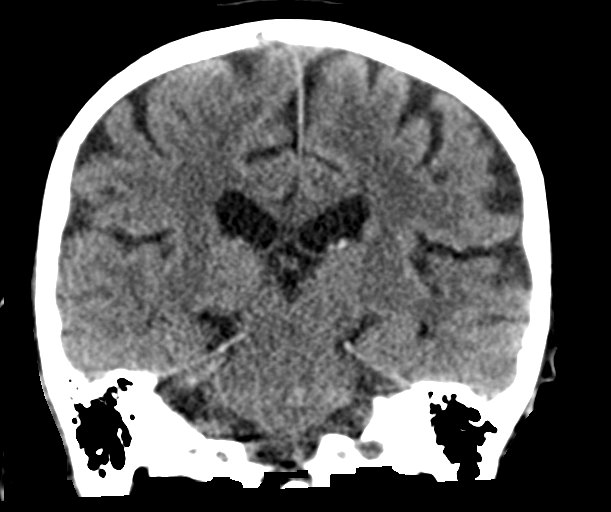
[im 37/67  brain]
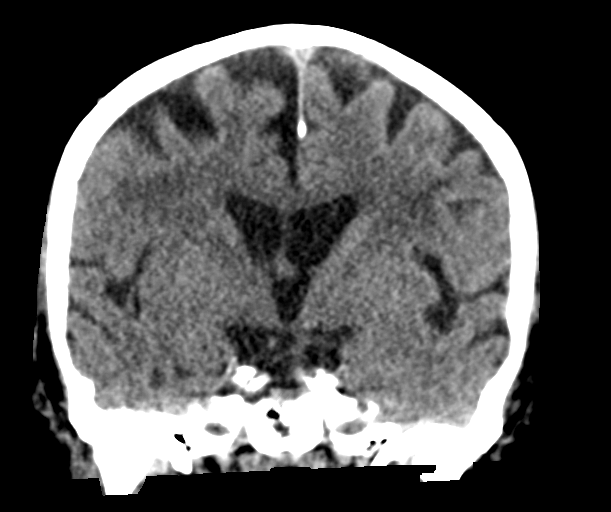

[Series 6: head without sag · sagittal · non-contrast · 0.28mm/px · 3 of 58 slices shown]
[im 20/58  brain]
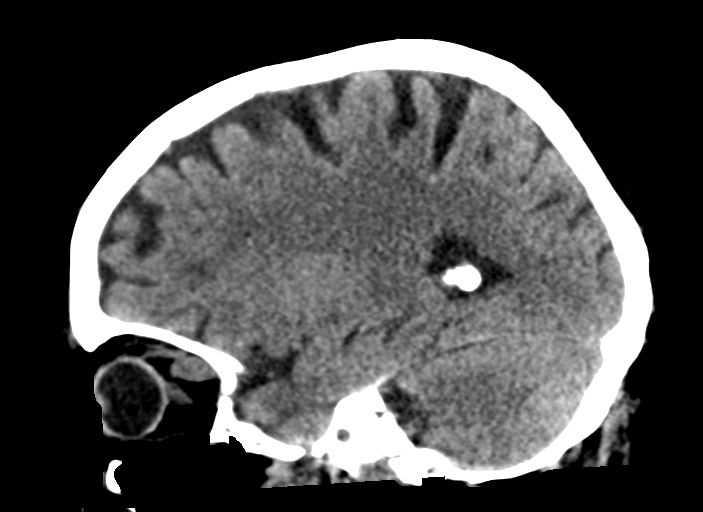
[im 29/58  brain]
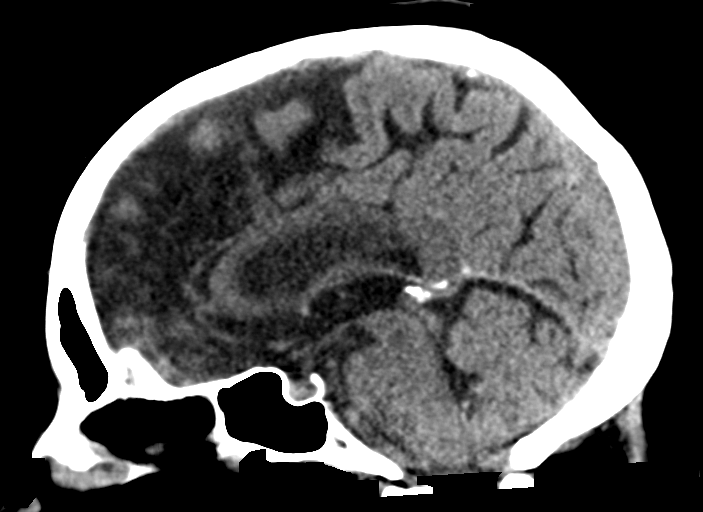
[im 39/58  brain]
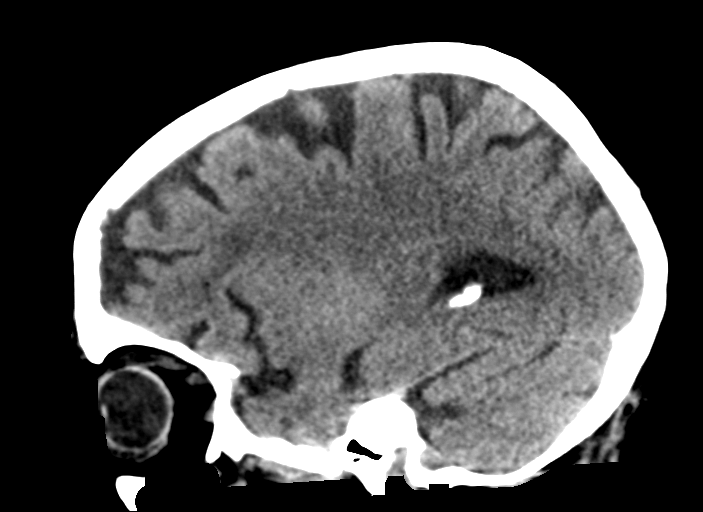

[16 of 47 positions shown; findings below may reference images not displayed]

FINDINGS: Brain: Mild generalized age related parenchymal atrophy with
commensurate dilatation of the ventricles and sulci. No
hydrocephalus. Chronic small vessel ischemic changes noted within
the bilateral periventricular white matter regions. Small old LEFT
frontal lobe infarction with associated encephalomalacia.

No mass, hemorrhage, edema or other evidence of acute parenchymal
abnormality. No extra-axial hemorrhage.

Vascular: There are chronic calcified atherosclerotic changes of the
large vessels at the skull base. No unexpected hyperdense vessel.

Skull: Normal. Negative for fracture or focal lesion.

Sinuses/Orbits: No acute finding.

Other: None.
IMPRESSION: 1. No acute findings.  No intracranial mass, hemorrhage or edema.
2. Chronic small-vessel ischemic changes in the white matter.

## 2024-11-11 DEATH — deceased
